# Patient Record
Sex: Female | Born: 1950 | Race: White | Hispanic: No | Marital: Married | Smoking: Never smoker
Health system: Southern US, Community
[De-identification: ages and names within clinical notes are randomized; demographics above are authoritative.]

## PROBLEM LIST (undated history)

## (undated) DIAGNOSIS — T7840XA Allergy, unspecified, initial encounter: Secondary | ICD-10-CM

## (undated) DIAGNOSIS — H269 Unspecified cataract: Secondary | ICD-10-CM

## (undated) HISTORY — PX: EYE SURGERY: SHX253

## (undated) HISTORY — DX: Allergy, unspecified, initial encounter: T78.40XA

## (undated) HISTORY — PX: BREAST SURGERY: SHX581

## (undated) HISTORY — DX: Unspecified cataract: H26.9

---

## 2002-10-18 HISTORY — PX: ENDOMETRIAL ABLATION: SHX621

## 2021-02-18 DIAGNOSIS — E785 Hyperlipidemia, unspecified: Secondary | ICD-10-CM

## 2021-02-18 DIAGNOSIS — W57XXXA Bitten or stung by nonvenomous insect and other nonvenomous arthropods, initial encounter: Secondary | ICD-10-CM | POA: Insufficient documentation

## 2021-02-18 DIAGNOSIS — H9190 Unspecified hearing loss, unspecified ear: Secondary | ICD-10-CM | POA: Insufficient documentation

## 2021-02-18 DIAGNOSIS — M8580 Other specified disorders of bone density and structure, unspecified site: Secondary | ICD-10-CM | POA: Insufficient documentation

## 2021-02-18 DIAGNOSIS — M810 Age-related osteoporosis without current pathological fracture: Secondary | ICD-10-CM

## 2021-02-18 HISTORY — DX: Age-related osteoporosis without current pathological fracture: M81.0

## 2021-02-18 HISTORY — DX: Hyperlipidemia, unspecified: E78.5

## 2021-06-03 ENCOUNTER — Ambulatory Visit
Admission: RE | Admit: 2021-06-03 | Discharge: 2021-06-03 | Disposition: A | Discharge status: Home / Self Care | Payer: Medicare PPO | Source: Ambulatory Visit | Attending: Internal Medicine | Admitting: Internal Medicine

## 2021-06-03 ENCOUNTER — Other Ambulatory Visit: Payer: Self-pay

## 2021-06-03 VITALS — BP 113/77 | HR 89 | Temp 98.7°F | Resp 18

## 2021-06-03 DIAGNOSIS — H109 Unspecified conjunctivitis: Secondary | ICD-10-CM

## 2021-06-03 DIAGNOSIS — J0100 Acute maxillary sinusitis, unspecified: Secondary | ICD-10-CM | POA: Diagnosis not present

## 2021-06-03 MED ORDER — ERYTHROMYCIN 5 MG/GM OP OINT: TOPICAL_OINTMENT | OPHTHALMIC | 0 refills | Status: DC

## 2021-06-03 MED ORDER — AMOXICILLIN 875 MG PO TABS: 875.0000 mg | ORAL_TABLET | Freq: Two times a day (BID) | ORAL | 0 refills | Status: AC

## 2021-06-03 NOTE — Discharge Instructions (Addendum)
You are being treated for acute sinus infection with amoxicillin antibiotic.  You are also being treated for left pinkeye with erythromycin eye ointment.  Please change pillowcase nightly to decrease transition of infection to other eye. Covid 19 test is pending. We will call if it is positive.

## 2021-06-03 NOTE — ED Triage Notes (Signed)
Pt said x 2 day has been having sinus headache and pressure and watery eyes. Pt said her right side of her teeth are hurting. Pt said green snot, pt has been taking mucinex and was helping but pain in face and swelling is getting worse.

## 2021-06-03 NOTE — ED Provider Notes (Signed)
[facility]    CSN: [id number] Arrival date & time: 06/03/21  1049      History   Chief Complaint Chief Complaint  Patient presents with   Facial Pain    HPI Lindsey Zuniga is a 70 y.o. female.   Patient presents with 1 week history of facial pressure, nasal congestion, watery eyes, right-sided teeth pain.  States that she has had some crusty drainage from her left eye that started yesterday.  Denies any blurry vision.  Denies any known fevers.  Granddaughter had similar symptoms approximately 1 week ago.  Has been taking over-the-counter Mucinex with no relief of symptoms.  Denies any chest pain or shortness of breath.    History reviewed. No pertinent past medical history.  There are no problems to display for this patient.   History reviewed. No pertinent surgical history.  OB History   No obstetric history on file.      Home Medications    Prior to Admission medications   Medication Sig Start Date End Date Taking? Authorizing Provider  amoxicillin (AMOXIL) 875 MG tablet Take 1 tablet (875 mg total) by mouth 2 (two) times daily for 10 days. 06/03/21 06/13/21 Yes Odis Luster, FNP  erythromycin ophthalmic ointment Place a 1 cm ribbon of ointment into the lower eyelid 4 times daily for 7 days. 06/03/21  Yes Odis Luster, FNP    Family History History reviewed. No pertinent family history.  Social History Social History   Tobacco Use   Smoking status: Never   Smokeless tobacco: Never  Substance Use Topics   Alcohol use: Not Currently   Drug use: Never     Allergies   Patient has no allergy information on record.   Review of Systems Review of Systems Per HPI  Physical Exam Triage Vital Signs ED Triage Vitals  Enc Vitals Group     BP 06/03/21 1115 113/77     Pulse Rate 06/03/21 1115 89     Resp 06/03/21 1115 18     Temp 06/03/21 1118 98.7 F (37.1 C)     Temp Source 06/03/21 1115 Oral     SpO2 06/03/21 1115 96 %      Weight --      Height --      Head Circumference --      Peak Flow --      Pain Score 06/03/21 1110 4     Pain Loc --      Pain Edu? --      Excl. in [place]? --    No data found.  Updated Vital Signs BP 113/77 (BP Location: Right Arm)   Pulse 89   Temp 98.7 F (37.1 C) (Oral)   Resp 18   SpO2 96%   Visual Acuity Right Eye Distance:   Left Eye Distance:   Bilateral Distance:    Right Eye Near:   Left Eye Near:    Bilateral Near:     Physical Exam Constitutional:      General: She is not in acute distress.    Appearance: Normal appearance.  HENT:     Head: Normocephalic and atraumatic.     Right Ear: Tympanic membrane and ear canal normal.     Left Ear: Tympanic membrane and ear canal normal.     Nose: Congestion present.     Right Sinus: Maxillary sinus tenderness present.     Left Sinus: Maxillary sinus tenderness present.     Mouth/Throat:  Mouth: Mucous membranes are moist.     Pharynx: Oropharynx is clear. No posterior oropharyngeal erythema.  Eyes:     General: Lids are normal.     Extraocular Movements: Extraocular movements intact.     Conjunctiva/sclera:     Left eye: Left conjunctiva is injected. No exudate.    Pupils: Pupils are equal, round, and reactive to light.  Cardiovascular:     Rate and Rhythm: Normal rate and regular rhythm.     Pulses: Normal pulses.     Heart sounds: Normal heart sounds.  Pulmonary:     Effort: Pulmonary effort is normal. No respiratory distress.     Breath sounds: Normal breath sounds. No wheezing.  Abdominal:     General: Abdomen is flat. Bowel sounds are normal.     Palpations: Abdomen is soft.  Musculoskeletal:        General: Normal range of motion.     Cervical back: Normal range of motion.  Skin:    General: Skin is warm and dry.  Neurological:     General: No focal deficit present.     Mental Status: She is alert and oriented to person, place, and time. Mental status is at baseline.  Psychiatric:         Mood and Affect: Mood normal.        Behavior: Behavior normal.     UC Treatments / Results  Labs (all labs ordered are listed, but only abnormal results are displayed) Labs Reviewed  NOVEL CORONAVIRUS, NAA    EKG   Radiology No results found.  Procedures Procedures (including critical care time)  Medications Ordered in UC Medications - No data to display  Initial Impression / Assessment and Plan / UC Course  I have reviewed the triage vital signs and the nursing notes.  Pertinent labs & imaging results that were available during my care of the patient were reviewed by me and considered in my medical decision making (see chart for details).     Physical exam most consistent with acute maxillary sinusitis.  Will treat with amoxicillin x10 days. Will treat left bacterial conjunctivitis with erythromycin.  No blurry vision and visual acuity appears normal.  COVID-19 test pending to rule out.  Discussed over-the-counter medications to alleviate symptoms with patient.Discussed strict return precautions. Patient verbalized understanding and is agreeable with plan.  Final Clinical Impressions(s) / UC Diagnoses   Final diagnoses:  Acute non-recurrent maxillary sinusitis  Bacterial conjunctivitis of left eye     Discharge Instructions      You are being treated for acute sinus infection with amoxicillin antibiotic.  You are also being treated for left pinkeye with erythromycin eye ointment.  Please change pillowcase nightly to decrease transition of infection to other eye. Covid 19 test is pending. We will call if it is positive.      ED Prescriptions     Medication Sig Dispense Auth. Provider   amoxicillin (AMOXIL) 875 MG tablet Take 1 tablet (875 mg total) by mouth 2 (two) times daily for 10 days. 20 tablet Odis Luster, FNP   erythromycin ophthalmic ointment Place a 1 cm ribbon of ointment into the lower eyelid 4 times daily for 7 days. 3.5 g Odis Luster, FNP       PDMP not reviewed this encounter.   Odis Luster, FNP 06/03/21 1202

## 2021-06-04 LAB — SARS-COV-2, NAA 2 DAY TAT

## 2021-06-04 LAB — NOVEL CORONAVIRUS, NAA: SARS-CoV-2, NAA: NOT DETECTED

## 2021-08-05 ENCOUNTER — Other Ambulatory Visit: Payer: Self-pay

## 2021-08-05 ENCOUNTER — Ambulatory Visit
Admission: RE | Admit: 2021-08-05 | Discharge: 2021-08-05 | Disposition: A | Discharge status: Home / Self Care | Payer: Medicare PPO | Source: Ambulatory Visit

## 2021-08-05 VITALS — BP 135/80 | HR 101 | Temp 98.2°F | Resp 18

## 2021-08-05 DIAGNOSIS — J0100 Acute maxillary sinusitis, unspecified: Secondary | ICD-10-CM | POA: Diagnosis not present

## 2021-08-05 MED ORDER — AMOXICILLIN-POT CLAVULANATE 875-125 MG PO TABS: 1.0000 | ORAL_TABLET | Freq: Two times a day (BID) | ORAL | 0 refills | Status: DC

## 2021-08-05 NOTE — ED Triage Notes (Signed)
Pt c/o nasal congestion and sinus pressure to lt upper cheek x1wk. States had the same sx's in august and had a sinus infection.

## 2021-08-05 NOTE — ED Provider Notes (Signed)
[facility]    CSN: [id number] Arrival date & time: 08/05/21  0945      History   Chief Complaint Chief Complaint  Patient presents with   appt 10-nasal congrestion    HPI Lindsey Zuniga is a 70 y.o. female.   Patient here today for evaluation of left maxillary sinus pressure and pain that has developed over the last week.  She reports she has had similar symptoms before and was diagnosed with sinus infection which improved with antibiotics.  She denies any fever.  She has not had any nausea, vomiting or diarrhea.  She has tried taking Mucinex and Claritin-D without resolution of symptoms.  The history is provided by the patient.   History reviewed. No pertinent past medical history.  There are no problems to display for this patient.   History reviewed. No pertinent surgical history.  OB History   No obstetric history on file.      Home Medications    Prior to Admission medications   Medication Sig Start Date End Date Taking? Authorizing Provider  amoxicillin-clavulanate (AUGMENTIN) 875-125 MG tablet Take 1 tablet by mouth every 12 (twelve) hours. 08/05/21  Yes Francene Finders, PA-C  aspirin (EQ ASPIRIN ADULT LOW DOSE) 81 MG EC tablet Take by mouth.    [provider]  Multiple Vitamin (MULTIVITAMIN) capsule Take 1 capsule by mouth daily.    [provider]  simvastatin (ZOCOR) 20 MG tablet Take 10 mg by mouth at bedtime. 07/23/21   [provider]    Family History History reviewed. No pertinent family history.  Social History Social History   Tobacco Use   Smoking status: Never   Smokeless tobacco: Never  Substance Use Topics   Alcohol use: Not Currently   Drug use: Never     Allergies   Fosamax [alendronate]   Review of Systems Review of Systems  Constitutional:  Negative for chills and fever.  HENT:  Positive for congestion and sinus pressure. Negative for ear pain and sore throat.   Eyes:   Negative for discharge and redness.  Respiratory:  Positive for cough (mild- from drainage). Negative for shortness of breath and wheezing.   Gastrointestinal:  Negative for abdominal pain, diarrhea, nausea and vomiting.    Physical Exam Triage Vital Signs ED Triage Vitals  Enc Vitals Group     BP      Pulse      Resp      Temp      Temp src      SpO2      Weight      Height      Head Circumference      Peak Flow      Pain Score      Pain Loc      Pain Edu?      Excl. in [place]?    No data found.  Updated Vital Signs BP 135/80 (BP Location: Left Arm)   Pulse (!) 101   Temp 98.2 F (36.8 C) (Oral)   Resp 18   SpO2 97%       Physical Exam Vitals and nursing note reviewed.  Constitutional:      General: She is not in acute distress.    Appearance: Normal appearance. She is not ill-appearing.  HENT:     Head: Normocephalic and atraumatic.     Right Ear: Tympanic membrane normal.     Left Ear: Tympanic membrane normal.     Nose:  Congestion present.     Mouth/Throat:     Mouth: Mucous membranes are moist.     Pharynx: No oropharyngeal exudate or posterior oropharyngeal erythema.  Eyes:     Conjunctiva/sclera: Conjunctivae normal.  Cardiovascular:     Rate and Rhythm: Normal rate and regular rhythm.     Heart sounds: Normal heart sounds. No murmur heard. Pulmonary:     Effort: Pulmonary effort is normal. No respiratory distress.     Breath sounds: Normal breath sounds. No wheezing, rhonchi or rales.  Skin:    General: Skin is warm and dry.  Neurological:     Mental Status: She is alert.  Psychiatric:        Mood and Affect: Mood normal.        Thought Content: Thought content normal.     UC Treatments / Results  Labs (all labs ordered are listed, but only abnormal results are displayed) Labs Reviewed - No data to display  EKG   Radiology No results found.  Procedures Procedures (including critical care time)  Medications Ordered in UC Medications  - No data to display  Initial Impression / Assessment and Plan / UC Course  I have reviewed the triage vital signs and the nursing notes.  Pertinent labs & imaging results that were available during my care of the patient were reviewed by me and considered in my medical decision making (see chart for details).  Suspect likely sinusitis and will treat with Augmentin.  Recommended follow-up if no gradual improvement of symptoms or if symptoms worsen anyway.  Final Clinical Impressions(s) / UC Diagnoses   Final diagnoses:  Acute maxillary sinusitis, recurrence not specified   Discharge Instructions   None    ED Prescriptions     Medication Sig Dispense Auth. Provider   amoxicillin-clavulanate (AUGMENTIN) 875-125 MG tablet Take 1 tablet by mouth every 12 (twelve) hours. 14 tablet Francene Finders, PA-C      PDMP not reviewed this encounter.   Francene Finders, PA-C 08/05/21 1109

## 2021-08-24 ENCOUNTER — Other Ambulatory Visit: Payer: Self-pay

## 2021-08-25 ENCOUNTER — Encounter: Payer: Self-pay | Admitting: Nurse Practitioner

## 2021-08-25 ENCOUNTER — Ambulatory Visit: Payer: Medicare PPO | Admitting: Nurse Practitioner

## 2021-08-25 VITALS — BP 124/80 | HR 86 | Temp 96.9°F | Ht 67.25 in | Wt 182.6 lb

## 2021-08-25 DIAGNOSIS — L570 Actinic keratosis: Secondary | ICD-10-CM | POA: Diagnosis not present

## 2021-08-25 DIAGNOSIS — Z0001 Encounter for general adult medical examination with abnormal findings: Secondary | ICD-10-CM | POA: Diagnosis not present

## 2021-08-25 DIAGNOSIS — H903 Sensorineural hearing loss, bilateral: Secondary | ICD-10-CM

## 2021-08-25 DIAGNOSIS — M810 Age-related osteoporosis without current pathological fracture: Secondary | ICD-10-CM

## 2021-08-25 DIAGNOSIS — E785 Hyperlipidemia, unspecified: Secondary | ICD-10-CM | POA: Diagnosis not present

## 2021-08-25 DIAGNOSIS — Z9882 Breast implant status: Secondary | ICD-10-CM | POA: Insufficient documentation

## 2021-08-25 NOTE — Progress Notes (Signed)
Subjective:    Patient ID: Lindsey Zuniga, female    DOB: 1951-09-20, 70 y.o.   MRN: [id number]  Patient presents today for CPE or establish care (new patient) or eval of chronic conditions   HPI Transfer from [facility]. Last CPE 2021.  Osteoporosis Last bone density 2020 Current use of multivitamin aith calcium and vitamin D Allergic reaction to fosamax. Able to tolerate boniva and actonel Took boniva x 35yrs, d/c 75yrs ago due to fear of possible side effects after long term use.  Ordered repeat bone density   Hyperlipidemia Current use to simvastatin 10mg  daily Repeat lipid panel  Hearing loss Use of hearing aids  D/c fosamax due to allergic response Able to take boniva and actonel x 42yrs, d/c 41yrs ago Current use of multivitamin with calcium and vit. D.  Vision:up to date Dental:up to date Diet:regular. Exercise:none, but plans to start Weight:  Wt Readings from Last 3 Encounters:  08/25/21 182 lb 9.6 oz (82.8 kg)    Sexual History (orientation,birth control, marital status, STD):up to date with mammogram, last done 07/2021 (normal). S/p uterine ablation 2004, Last PAP 2017: normal, ( nor hx of abnormal PAP, no Fhx of uterine or cervical cancer)  Depression/Suicide: Depression screen [facility] 2/9 08/25/2021  Decreased Interest 0  Down, Depressed, Hopeless 0  PHQ - 2 Score 0  Altered sleeping 0  Tired, decreased energy 0  Change in appetite 0  Feeling bad or failure about yourself  0  Trouble concentrating 0  Moving slowly or fidgety/restless 0  Suicidal thoughts 0  PHQ-9 Score 0   GAD 7 : Generalized Anxiety Score 08/25/2021  Nervous, Anxious, on Edge 0  Control/stop worrying 0  Worry too much - different things 0  Trouble relaxing 0  Restless 0  Easily annoyed or irritable 0  Afraid - awful might happen 0  Total GAD 7 Score 0   Immunizations: (TDAP, Hep C screen, Pneumovax, Influenza, zoster): will get TDAP from retail pharmarcy, has  completed flu vaccine this season. Health Maintenance  Topic Date Due   Hepatitis C Screening: USPSTF Recommendation to screen - Ages 4-79 yo.  Never done   Tetanus Vaccine  Never done   Colon Cancer Screening  Never done   DEXA scan (bone density measurement)  Never done   COVID-19 Vaccine (4 - Booster for Pfizer series) 12/09/2020   Flu Shot  Never done   Mammogram  08/15/2023   Pneumonia Vaccine  Completed   Zoster (Shingles) Vaccine  Completed   HPV Vaccine  Aged Out   Fall Risk: Fall Risk  08/25/2021  Falls in the past year? 0  Number falls in past yr: 0  Injury with Fall? 0  Risk for fall due to : No Fall Risks  Follow up Falls evaluation completed   Advanced Directive: Advanced Directives 08/25/2021  Does Patient Have a Medical Advance Directive? No;Yes  Type of Paramedic of [place];Living will  Copy of [place] in Chart? No - copy requested    Medications and allergies reviewed with patient and updated if appropriate.  Patient Active Problem List   Diagnosis Date Noted   H/O bilateral breast implants 08/25/2021   Hearing loss 02/18/2021   Hyperlipidemia 02/18/2021   Osteoporosis 02/18/2021   Tick bite 02/18/2021    Current Outpatient Medications on File Prior to Visit  Medication Sig Dispense Refill   aspirin 81 MG EC tablet Take by mouth.     Multiple  Vitamin (MULTIVITAMIN) capsule Take 1 capsule by mouth daily.     simvastatin (ZOCOR) 20 MG tablet Take 10 mg by mouth at bedtime.     No current facility-administered medications on file prior to visit.   Past Medical History:  Diagnosis Date   Allergy Unknown   Cataract 2006   Right eye cataract surgery- left eye monitored   Hyperlipidemia 02/18/2021   Osteoporosis 02/18/2021    Past Surgical History:  Procedure Laterality Date   [place]   EYE SURGERY  2006   Cataract right eye    Social History    Socioeconomic History   Marital status: Married    Spouse name: Not on file   Number of children: Not on file   Years of education: Not on file   Highest education level: Not on file  Occupational History   Not on file  Tobacco Use   Smoking status: Never   Smokeless tobacco: Never  Vaping Use   Vaping Use: Not on file  Substance and Sexual Activity   Alcohol use: Not Currently    Alcohol/week: 1.0 standard drink    Types: 1 Glasses of wine per week   Drug use: Never   Sexual activity: Yes    Birth control/protection: Post-menopausal  Other Topics Concern   Not on file  Social History Narrative   Not on file   Social Determinants of Health   Financial Resource Strain: Not on file  Food Insecurity: Not on file  Transportation Needs: Not on file  Physical Activity: Not on file  Stress: Not on file  Social Connections: Not on file    Family History  Problem Relation Age of Onset   Varicose Veins Mother    Cancer Father    Hearing loss Father    Varicose Veins Father    Hearing loss Maternal Grandfather    Hearing loss Maternal Grandmother    Hearing loss Paternal Grandmother        Review of Systems  Constitutional:  Negative for fever, malaise/fatigue and weight loss.  HENT:  Negative for congestion and sore throat.   Eyes:        Negative for visual changes  Respiratory:  Negative for cough and shortness of breath.   Cardiovascular:  Negative for chest pain, palpitations and leg swelling.  Gastrointestinal:  Negative for blood in stool, constipation, diarrhea and heartburn.  Genitourinary:  Negative for dysuria, frequency and urgency.  Musculoskeletal:  Negative for falls, joint pain and myalgias.  Skin:  Negative for rash.  Neurological:  Negative for dizziness, sensory change and headaches.  Endo/Heme/Allergies:  Does not bruise/bleed easily.  Psychiatric/Behavioral:  Negative for depression, hallucinations, substance abuse and suicidal ideas. The  patient is not nervous/anxious and does not have insomnia.    Objective:   Vitals:   08/25/21 1017  BP: 124/80  Pulse: 86  Temp: (!) 96.9 F (36.1 C)  SpO2: 98%    Body mass index is 28.39 kg/m.   Physical Examination:  Physical Exam Vitals reviewed.  Constitutional:      General: She is not in acute distress.    Appearance: She is well-developed. She is obese.  HENT:     Right Ear: Tympanic membrane, ear canal and external ear normal.     Left Ear: Tympanic membrane, ear canal and external ear normal.  Eyes:     Extraocular Movements: Extraocular movements intact.  Conjunctiva/sclera: Conjunctivae normal.  Cardiovascular:     Rate and Rhythm: Normal rate and regular rhythm.     Heart sounds: Normal heart sounds.  Pulmonary:     Effort: Pulmonary effort is normal. No respiratory distress.     Breath sounds: Normal breath sounds.  Chest:     Chest wall: No tenderness.  Abdominal:     General: Bowel sounds are normal.     Palpations: Abdomen is soft.  Musculoskeletal:        General: Normal range of motion.     Cervical back: Normal range of motion and neck supple.     Right lower leg: No edema.     Left lower leg: No edema.  Skin:    General: Skin is warm and dry.     Findings: Lesion present.          Comments: Irregular keratotic lesion on right posterior shoulder.  Neurological:     Mental Status: She is alert and oriented to person, place, and time.     Deep Tendon Reflexes: Reflexes are normal and symmetric.  Psychiatric:        Mood and Affect: Mood normal.        Behavior: Behavior normal.        Thought Content: Thought content normal.   ASSESSMENT and PLAN: This visit occurred during the SARS-CoV-2 public health emergency.  Safety protocols were in place, including screening questions prior to the visit, additional usage of staff PPE, and extensive cleaning of exam room while observing appropriate contact time as indicated for disinfecting  solutions.   Kadie was seen today for establish care.  Diagnoses and all orders for this visit:  Encounter for preventative adult health care exam with abnormal findings -     Comprehensive metabolic panel; Future -     CBC; Future  Hyperlipidemia, unspecified hyperlipidemia type -     Lipid panel; Future  Age-related osteoporosis without current pathological fracture -     DG Bone Density; Future  Actinic keratosis -     Ambulatory referral to Dermatology  Sensorineural hearing loss (SNHL) of both ears    Schedule lab appt for blood draw. Need to be fasting 8hrs prior to blood draw You will be contacted to schedule appt for bone density. Please send copy of living will and health care power of attorney. Send dates of last flu and TDAP vaccine Start and maintain daily exercise and heart healthy diet.  Problem List Items Addressed This Visit       Nervous and Auditory   Hearing loss    Use of hearing aids        Musculoskeletal and Integument   Osteoporosis    Last bone density 2020 Current use of multivitamin aith calcium and vitamin D Allergic reaction to fosamax. Able to tolerate boniva and actonel Took boniva x 54yrs, d/c 44yrs ago due to fear of possible side effects after long term use.  Ordered repeat bone density       Relevant Orders   DG Bone Density     Other   Hyperlipidemia    Current use to simvastatin 10mg  daily Repeat lipid panel      Relevant Orders   Lipid panel   Other Visit Diagnoses     Encounter for preventative adult health care exam with abnormal findings    -  Primary   Relevant Orders   Comprehensive metabolic panel   CBC   Actinic keratosis  Relevant Orders   Ambulatory referral to Dermatology       Follow up: Return in about 1 year (around 08/25/2022) for CPE (fasting).  Wilfred Lacy, NP

## 2021-08-25 NOTE — Patient Instructions (Addendum)
Thank you for choosing [facility]  Schedule lab appt for blood draw. Need to be fasting 8hrs prior to blood draw  You will be contacted to schedule appt for bone density.  Please send copy of living will and health care power of attorney.  Send dates of last flu and TDAP vaccine  Preventive Care 39 Years and Older, Female Preventive care refers to lifestyle choices and visits with your health care provider that can promote health and wellness. Preventive care visits are also called wellness exams. What can I expect for my preventive care visit? Counseling Your health care provider may ask you questions about your: Medical history, including: Past medical problems. Family medical history. Pregnancy and menstrual history. History of falls. Current health, including: Memory and ability to understand (cognition). Emotional well-being. Home life and relationship well-being. Sexual activity and sexual health. Lifestyle, including: Alcohol, nicotine or tobacco, and drug use. Access to firearms. Diet, exercise, and sleep habits. Work and work Statistician. Sunscreen use. Safety issues such as seatbelt and bike helmet use. Physical exam Your health care provider will check your: Height and weight. These may be used to calculate your BMI (body mass index). BMI is a measurement that tells if you are at a healthy weight. Waist circumference. This measures the distance around your waistline. This measurement also tells if you are at a healthy weight and may help predict your risk of certain diseases, such as type 2 diabetes and high blood pressure. Heart rate and blood pressure. Body temperature. Skin for abnormal spots. What immunizations do I need? Vaccines are usually given at various ages, according to a schedule. Your health care provider will recommend vaccines for you based on your age, medical history, and lifestyle or other factors, such as travel or where you work. What  tests do I need? Screening Your health care provider may recommend screening tests for certain conditions. This may include: Lipid and cholesterol levels. Hepatitis C test. Hepatitis B test. HIV (human immunodeficiency virus) test. STI (sexually transmitted infection) testing, if you are at risk. Lung cancer screening. Colorectal cancer screening. Diabetes screening. This is done by checking your blood sugar (glucose) after you have not eaten for a while (fasting). Mammogram. Talk with your health care provider about how often you should have regular mammograms. BRCA-related cancer screening. This may be done if you have a family history of breast, ovarian, tubal, or peritoneal cancers. Bone density scan. This is done to screen for osteoporosis. Talk with your health care provider about your test results, treatment options, and if necessary, the need for more tests. Follow these instructions at home: Eating and drinking  Eat a diet that includes fresh fruits and vegetables, whole grains, lean protein, and low-fat dairy products. Limit your intake of foods with high amounts of sugar, saturated fats, and salt. Take vitamin and mineral supplements as recommended by your health care provider. Do not drink alcohol if your health care provider tells you not to drink. If you drink alcohol: Limit how much you have to 0-1 drink a day. Know how much alcohol is in your drink. In the [place] one drink equals one 12 oz bottle of beer (355 mL), one 5 oz glass of wine (148 mL), or one 1 oz glass of hard liquor (44 mL). Lifestyle Brush your teeth every morning and night with fluoride toothpaste. Floss one time each day. Exercise for at least 30 minutes 5 or more days each week. Do not use any products that contain  nicotine or tobacco. These products include cigarettes, chewing tobacco, and vaping devices, such as e-cigarettes. If you need help quitting, ask your health care provider. Do not use drugs. If  you are sexually active, practice safe sex. Use a condom or other form of protection in order to prevent STIs. Take aspirin only as told by your health care provider. Make sure that you understand how much to take and what form to take. Work with your health care provider to find out whether it is safe and beneficial for you to take aspirin daily. Ask your health care provider if you need to take a cholesterol-lowering medicine (statin). Find healthy ways to manage stress, such as: Meditation, yoga, or listening to music. Journaling. Talking to a trusted person. Spending time with friends and family. Minimize exposure to UV radiation to reduce your risk of skin cancer. Safety Always wear your seat belt while driving or riding in a vehicle. Do not drive: If you have been drinking alcohol. Do not ride with someone who has been drinking. When you are tired or distracted. While texting. If you have been using any mind-altering substances or drugs. Wear a helmet and other protective equipment during sports activities. If you have firearms in your house, make sure you follow all gun safety procedures. What's next? Visit your health care provider once a year for an annual wellness visit. Ask your health care provider how often you should have your eyes and teeth checked. Stay up to date on all vaccines. This information is not intended to replace advice given to you by your health care provider. Make sure you discuss any questions you have with your health care provider. Document Revised: 04/01/2021 Document Reviewed: 04/01/2021 Elsevier Patient Education  [place].

## 2021-08-25 NOTE — Progress Notes (Deleted)
   Subjective:  Patient ID: Lindsey Zuniga, female    DOB: 06/02/1951  Age: 70 y.o. MRN: [id number]  CC: Establish Care (New patient/)   HPI  No problem-specific Assessment & Plan notes found for this encounter.   Reviewed past Medical, Social and Family history today.  Outpatient Medications Prior to Visit  Medication Sig Dispense Refill   aspirin 81 MG EC tablet Take by mouth.     Multiple Vitamin (MULTIVITAMIN) capsule Take 1 capsule by mouth daily.     simvastatin (ZOCOR) 20 MG tablet Take 10 mg by mouth at bedtime.     amoxicillin-clavulanate (AUGMENTIN) 875-125 MG tablet Take 1 tablet by mouth every 12 (twelve) hours. 14 tablet 0   No facility-administered medications prior to visit.    ROS See HPI  Objective:  BP 124/80 (BP Location: Left Arm, Patient Position: Sitting, Cuff Size: Normal)   Pulse 86   Temp (!) 96.9 F (36.1 C) (Temporal)   Ht 5' 7.25" (1.708 m)   Wt 182 lb 9.6 oz (82.8 kg)   SpO2 98%   BMI 28.39 kg/m   Physical Exam  Assessment & Plan:  This visit occurred during the SARS-CoV-2 public health emergency.  Safety protocols were in place, including screening questions prior to the visit, additional usage of staff PPE, and extensive cleaning of exam room while observing appropriate contact time as indicated for disinfecting solutions.   There are no diagnoses linked to this encounter.  Problem List Items Addressed This Visit   None    I have spent ***mins with this patient regarding history taking, documentation, review of *** (labs, radiology, specialty note), formulating plan and discussing treatment options with patient.  Follow-up: No follow-ups on file.  Wilfred Lacy, NP

## 2021-08-25 NOTE — Assessment & Plan Note (Signed)
Current use to simvastatin 10mg  daily Repeat lipid panel

## 2021-08-25 NOTE — Assessment & Plan Note (Signed)
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 Able to tolerate boniva and actonel Took boniva x 72yrs, d/c 80yrs ago due to fear of possible side effects after long term use.  Ordered repeat bone density

## 2021-08-25 NOTE — Assessment & Plan Note (Signed)
Use of hearing aids

## 2021-08-27 ENCOUNTER — Encounter: Payer: Self-pay | Admitting: Nurse Practitioner

## 2021-09-01 ENCOUNTER — Other Ambulatory Visit (INDEPENDENT_AMBULATORY_CARE_PROVIDER_SITE_OTHER): Payer: Medicare PPO

## 2021-09-01 ENCOUNTER — Other Ambulatory Visit: Payer: Self-pay

## 2021-09-01 DIAGNOSIS — E785 Hyperlipidemia, unspecified: Secondary | ICD-10-CM | POA: Diagnosis not present

## 2021-09-01 DIAGNOSIS — Z0001 Encounter for general adult medical examination with abnormal findings: Secondary | ICD-10-CM | POA: Diagnosis not present

## 2021-09-01 LAB — LIPID PANEL
Cholesterol: 150 mg/dL (ref 0–200)
HDL: 58.2 mg/dL (ref >39.00)
LDL Cholesterol: 74 mg/dL (ref 0–99)
NonHDL: 91.75
Total CHOL/HDL Ratio: 3
Triglycerides: 91 mg/dL (ref 0.0–149.0)
VLDL: 18.2 mg/dL (ref 0.0–40.0)

## 2021-09-01 LAB — CBC
HCT: 43.9 % (ref 36.0–46.0)
Hemoglobin: 14.4 g/dL (ref 12.0–15.0)
MCHC: 32.7 g/dL (ref 30.0–36.0)
MCV: 95.1 fl (ref 78.0–100.0)
Platelets: 266 10*3/uL (ref 150.0–400.0)
RBC: 4.62 Mil/uL (ref 3.87–5.11)
RDW: 13.4 % (ref 11.5–15.5)
WBC: 6.5 10*3/uL (ref 4.0–10.5)

## 2021-09-01 LAB — COMPREHENSIVE METABOLIC PANEL
ALT: 15 U/L (ref 0–35)
AST: 18 U/L (ref 0–37)
Albumin: 4.2 g/dL (ref 3.5–5.2)
Alkaline Phosphatase: 85 U/L (ref 39–117)
BUN: 17 mg/dL (ref 6–23)
CO2: 30 mEq/L (ref 19–32)
Calcium: 8.9 mg/dL (ref 8.4–10.5)
Chloride: 105 mEq/L (ref 96–112)
Creatinine, Ser: 0.7 mg/dL (ref 0.40–1.20)
GFR: 88.33 mL/min (ref >60.00)
Glucose, Bld: 87 mg/dL (ref 70–99)
Potassium: 4.2 mEq/L (ref 3.5–5.1)
Sodium: 141 mEq/L (ref 135–145)
Total Bilirubin: 0.7 mg/dL (ref 0.2–1.2)
Total Protein: 6.8 g/dL (ref 6.0–8.3)

## 2021-09-02 MED ORDER — SIMVASTATIN 20 MG PO TABS: 10.0000 mg | ORAL_TABLET | Freq: Every day | ORAL | 3 refills | Status: DC

## 2021-09-02 NOTE — Addendum Note (Signed)
Addended by: Wilfred Lacy L on: 09/02/2021 09:00 AM   Modules accepted: Orders

## 2021-10-21 ENCOUNTER — Ambulatory Visit (INDEPENDENT_AMBULATORY_CARE_PROVIDER_SITE_OTHER): Payer: Medicare PPO

## 2021-10-21 DIAGNOSIS — Z1211 Encounter for screening for malignant neoplasm of colon: Secondary | ICD-10-CM

## 2021-10-21 DIAGNOSIS — Z0000 Encounter for general adult medical examination without abnormal findings: Secondary | ICD-10-CM

## 2021-10-21 NOTE — Patient Instructions (Signed)
Lindsey Zuniga , Thank you for taking time to come for your Medicare Wellness Visit. I appreciate your ongoing commitment to your health goals. Please review the following plan we discussed and let me know if I can assist you in the future.   Screening recommendations/referrals: Colonoscopy: referral 10/21/2021 Mammogram: 08/14/2021 Bone Density: 02/05/2022 Recommended yearly ophthalmology/optometry visit for glaucoma screening and checkup Recommended yearly dental visit for hygiene and checkup  Vaccinations: Influenza vaccine: Completed  Pneumococcal vaccine: Completed  Tdap vaccine: 08/26/2021 Shingles vaccine: completed     Advanced directives: yes   Conditions/risks identified: none   Next appointment: none    Preventive Care 70 Years and Older, Female Preventive care refers to lifestyle choices and visits with your health care provider that can promote health and wellness. What does preventive care include? A yearly physical exam. This is also called an annual well check. Dental exams once or twice a year. Routine eye exams. Ask your health care provider how often you should have your eyes checked. Personal lifestyle choices, including: Daily care of your teeth and gums. Regular physical activity. Eating a healthy diet. Avoiding tobacco and drug use. Limiting alcohol use. Practicing safe sex. Taking low-dose aspirin every day. Taking vitamin and mineral supplements as recommended by your health care provider. What happens during an annual well check? The services and screenings done by your health care provider during your annual well check will depend on your age, overall health, lifestyle risk factors, and family history of disease. Counseling  Your health care provider may ask you questions about your: Alcohol use. Tobacco use. Drug use. Emotional well-being. Home and relationship well-being. Sexual activity. Eating habits. History of falls. Memory and ability  to understand (cognition). Work and work Statistician. Reproductive health. Screening  You may have the following tests or measurements: Height, weight, and BMI. Blood pressure. Lipid and cholesterol levels. These may be checked every 5 years, or more frequently if you are over 20 years old. Skin check. Lung cancer screening. You may have this screening every year starting at age 14 if you have a 30-pack-year history of smoking and currently smoke or have quit within the past 15 years. Fecal occult blood test (FOBT) of the stool. You may have this test every year starting at age 7. Flexible sigmoidoscopy or colonoscopy. You may have a sigmoidoscopy every 5 years or a colonoscopy every 10 years starting at age 70. Hepatitis C blood test. Hepatitis B blood test. Sexually transmitted disease (STD) testing. Diabetes screening. This is done by checking your blood sugar (glucose) after you have not eaten for a while (fasting). You may have this done every 1-3 years. Bone density scan. This is done to screen for osteoporosis. You may have this done starting at age 65. Mammogram. This may be done every 1-2 years. Talk to your health care provider about how often you should have regular mammograms. Talk with your health care provider about your test results, treatment options, and if necessary, the need for more tests. Vaccines  Your health care provider may recommend certain vaccines, such as: Influenza vaccine. This is recommended every year. Tetanus, diphtheria, and acellular pertussis (Tdap, Td) vaccine. You may need a Td booster every 10 years. Zoster vaccine. You may need this after age 70. Pneumococcal 13-valent conjugate (PCV13) vaccine. One dose is recommended after age 70. Pneumococcal polysaccharide (PPSV23) vaccine. One dose is recommended after age 70. Talk to your health care provider about which screenings and vaccines you need and how often  you need them. This information is not  intended to replace advice given to you by your health care provider. Make sure you discuss any questions you have with your health care provider. Document Released: 10/31/2015 Document Revised: 06/23/2016 Document Reviewed: 08/05/2015 Elsevier Interactive Patient Education  2017 [place] Prevention in the Home Falls can cause injuries. They can happen to people of all ages. There are many things you can do to make your home safe and to help prevent falls. What can I do on the outside of my home? Regularly fix the edges of walkways and driveways and fix any cracks. Remove anything that might make you trip as you walk through a door, such as a raised step or threshold. Trim any bushes or trees on the path to your home. Use bright outdoor lighting. Clear any walking paths of anything that might make someone trip, such as rocks or tools. Regularly check to see if handrails are loose or broken. Make sure that both sides of any steps have handrails. Any raised decks and porches should have guardrails on the edges. Have any leaves, snow, or ice cleared regularly. Use sand or salt on walking paths during winter. Clean up any spills in your garage right away. This includes oil or grease spills. What can I do in the bathroom? Use night lights. Install grab bars by the toilet and in the tub and shower. Do not use towel bars as grab bars. Use non-skid mats or decals in the tub or shower. If you need to sit down in the shower, use a plastic, non-slip stool. Keep the floor dry. Clean up any water that spills on the floor as soon as it happens. Remove soap buildup in the tub or shower regularly. Attach bath mats securely with double-sided non-slip rug tape. Do not have throw rugs and other things on the floor that can make you trip. What can I do in the bedroom? Use night lights. Make sure that you have a light by your bed that is easy to reach. Do not use any sheets or blankets that are  too big for your bed. They should not hang down onto the floor. Have a firm chair that has side arms. You can use this for support while you get dressed. Do not have throw rugs and other things on the floor that can make you trip. What can I do in the kitchen? Clean up any spills right away. Avoid walking on wet floors. Keep items that you use a lot in easy-to-reach places. If you need to reach something above you, use a strong step stool that has a grab bar. Keep electrical cords out of the way. Do not use floor polish or wax that makes floors slippery. If you must use wax, use non-skid floor wax. Do not have throw rugs and other things on the floor that can make you trip. What can I do with my stairs? Do not leave any items on the stairs. Make sure that there are handrails on both sides of the stairs and use them. Fix handrails that are broken or loose. Make sure that handrails are as long as the stairways. Check any carpeting to make sure that it is firmly attached to the stairs. Fix any carpet that is loose or worn. Avoid having throw rugs at the top or bottom of the stairs. If you do have throw rugs, attach them to the floor with carpet tape. Make sure that you have a light  switch at the top of the stairs and the bottom of the stairs. If you do not have them, ask someone to add them for you. What else can I do to help prevent falls? Wear shoes that: Do not have high heels. Have rubber bottoms. Are comfortable and fit you well. Are closed at the toe. Do not wear sandals. If you use a stepladder: Make sure that it is fully opened. Do not climb a closed stepladder. Make sure that both sides of the stepladder are locked into place. Ask someone to hold it for you, if possible. Clearly mark and make sure that you can see: Any grab bars or handrails. First and last steps. Where the edge of each step is. Use tools that help you move around (mobility aids) if they are needed. These  include: Canes. Walkers. Scooters. Crutches. Turn on the lights when you go into a dark area. Replace any light bulbs as soon as they burn out. Set up your furniture so you have a clear path. Avoid moving your furniture around. If any of your floors are uneven, fix them. If there are any pets around you, be aware of where they are. Review your medicines with your doctor. Some medicines can make you feel dizzy. This can increase your chance of falling. Ask your doctor what other things that you can do to help prevent falls. This information is not intended to replace advice given to you by your health care provider. Make sure you discuss any questions you have with your health care provider. Document Released: 07/31/2009 Document Revised: 03/11/2016 Document Reviewed: 11/08/2014 Elsevier Interactive Patient Education  2017 [employer].

## 2021-10-21 NOTE — Progress Notes (Signed)
Subjective:   Lindsey Zuniga is a 71 y.o. female who presents for an Initial Medicare Annual Wellness Visit.  Review of Systems     Cardiac Risk Factors include: advanced age (>24men, >61 women);dyslipidemia     Objective:    Today's Vitals   There is no height or weight on file to calculate BMI.  Advanced Directives 10/21/2021 08/25/2021  Does Patient Have a Medical Advance Directive? Yes No;Yes  Type of Paramedic of [place];Living will [place];Living will  Copy of [place] in Chart? No - copy requested No - copy requested    Current Medications (verified) Outpatient Encounter Medications as of 10/21/2021  Medication Sig   aspirin 81 MG EC tablet Take by mouth.   Cholecalciferol (VITAMIN D3) 50 MCG (2000 UT) TABS    ciprofloxacin (CIPRO) 500 MG tablet Take 500 mg by mouth 2 (two) times daily.   Multiple Vitamin (MULTIVITAMIN) capsule Take 1 capsule by mouth daily.   simvastatin (ZOCOR) 20 MG tablet Take 0.5 tablets (10 mg total) by mouth at bedtime.   cephALEXin (KEFLEX) 500 MG capsule Take 500 mg by mouth 4 (four) times daily.   No facility-administered encounter medications on file as of 10/21/2021.    Allergies (verified) Fosamax [alendronate]   History: Past Medical History:  Diagnosis Date   Allergy Unknown   Cataract 2006   Right eye cataract surgery- left eye monitored   Hyperlipidemia 02/18/2021   Osteoporosis 02/18/2021   Past Surgical History:  Procedure Laterality Date   [place]   EYE SURGERY  2006   Cataract right eye   Family History  Problem Relation Age of Onset   Varicose Veins Mother    Cancer Father    Hearing loss Father    Varicose Veins Father    Hearing loss Maternal Grandfather    Hearing loss Maternal Grandmother    Hearing loss Paternal Grandmother    Social History   Socioeconomic History   Marital status:  Married    Spouse name: Not on file   Number of children: Not on file   Years of education: Not on file   Highest education level: Not on file  Occupational History   Not on file  Tobacco Use   Smoking status: Never   Smokeless tobacco: Never  Vaping Use   Vaping Use: Not on file  Substance and Sexual Activity   Alcohol use: Not Currently    Alcohol/week: 1.0 standard drink    Types: 1 Glasses of wine per week   Drug use: Never   Sexual activity: Yes    Birth control/protection: Post-menopausal  Other Topics Concern   Not on file  Social History Narrative   Not on file   Social Determinants of Health   Financial Resource Strain: Low Risk    Difficulty of Paying Living Expenses: Not hard at all  Food Insecurity: No Food Insecurity   Worried About Charity fundraiser in the Last Year: Never true   [place] in the Last Year: Never true  Transportation Needs: No Transportation Needs   Lack of Transportation (Medical): No   Lack of Transportation (Non-Medical): No  Physical Activity: Insufficiently Active   Days of Exercise per Week: 2 days   Minutes of Exercise per Session: 20 min  Stress: No Stress Concern Present   Feeling of Stress : Not at all  Social Connections: Moderately Isolated   Frequency of Communication with Friends and Family: Twice a week   Frequency of Social Gatherings with Friends and Family: Twice a week   Attends Religious Services: Never   Printmaker: No   Attends Music therapist: Never   Marital Status: Married    Tobacco Counseling Counseling given: Not Answered   Clinical Intake:  Pre-visit preparation completed: Yes  Pain : No/denies pain     Nutritional Risks: None Diabetes: No  How often do you need to have someone help you when you read instructions, pamphlets, or other written materials from your doctor or pharmacy?: 1 - Never What is the last grade level you completed in  school?: High school  Diabetic?no   Interpreter Needed?: No  Information entered by :: L.Navy Belay,LPN   Activities of Daily Living In your present state of health, do you have any difficulty performing the following activities: 10/21/2021  Hearing? N  Vision? N  Difficulty concentrating or making decisions? N  Walking or climbing stairs? N  Dressing or bathing? N  Doing errands, shopping? N  Preparing Food and eating ? N  Using the Toilet? N  In the past six months, have you accidently leaked urine? N  Do you have problems with loss of bowel control? N  Managing your Medications? N  Managing your Finances? N  Housekeeping or managing your Housekeeping? N    Patient Care Team: Nche, Charlene Brooke, NP as PCP - General (Internal Medicine)  Indicate any recent Medical Services you may have received from other than [facility] providers in the past year (date may be approximate).     Assessment:   This is a routine wellness examination for Lindsey Zuniga.  Hearing/Vision screen Vision Screening - Comments:: Annual eye exams wears glasses   Dietary issues and exercise activities discussed: Current Exercise Habits: Home exercise routine, Type of exercise: walking, Time (Minutes): 20, Frequency (Times/Week): 2, Weekly Exercise (Minutes/Week): 40, Intensity: Mild, Exercise limited by: None identified   Goals Addressed   None    Depression Screen PHQ 2/9 Scores 10/21/2021 10/21/2021 08/25/2021  PHQ - 2 Score 0 0 0  PHQ- 9 Score - - 0    Fall Risk Fall Risk  10/21/2021 08/25/2021  Falls in the past year? 0 0  Number falls in past yr: 0 0  Injury with Fall? 0 0  Risk for fall due to : - No Fall Risks  Follow up Falls evaluation completed Falls evaluation completed    [place]:  Any stairs in or around the home? Yes  If so, are there any without handrails? No  Home free of loose throw rugs in walkways, pet beds, electrical cords, etc? Yes  Adequate lighting  in your home to reduce risk of falls? Yes   ASSISTIVE DEVICES UTILIZED TO PREVENT FALLS:  Life alert? No  Use of a cane, walker or w/c? No  Grab bars in the bathroom? No  Shower chair or bench in shower? No  Elevated toilet seat or a handicapped toilet? No     Cognitive Function:  Normal cognitive status assessed by direct observation by this Nurse Health Advisor. No abnormalities found.        Immunizations Immunization History  Administered Date(s) Administered   Influenza-Unspecified 07/17/2021   PFIZER(Purple Top)SARS-COV-2 Vaccination 11/10/2019, 12/01/2019, 10/14/2020   Pneumococcal Conjugate-13 07/14/2018   Pneumococcal Polysaccharide-23 09/27/2019   Td 08/26/2021   Zoster Recombinat (Shingrix)  09/27/2019, 12/27/2019    TDAP status: Up to date  Flu Vaccine status: Up to date  Pneumococcal vaccine status: Up to date  Covid-19 vaccine status: Completed vaccines  Qualifies for Shingles Vaccine? Yes   Zostavax completed Yes   Shingrix Completed?: Yes  Screening Tests Health Maintenance  Topic Date Due   Hepatitis C Screening  Never done   COLONOSCOPY (Pts 45-66yrs Insurance coverage will need to be confirmed)  Never done   DEXA SCAN  Never done   COVID-19 Vaccine (4 - Booster for Pfizer series) 12/09/2020   MAMMOGRAM  08/14/2022   TETANUS/TDAP  08/27/2031   Pneumonia Vaccine 15+ Years old  Completed   INFLUENZA VACCINE  Completed   Zoster Vaccines- Shingrix  Completed   HPV VACCINES  Aged Out    Health Maintenance  Health Maintenance Due  Topic Date Due   Hepatitis C Screening  Never done   COLONOSCOPY (Pts 45-45yrs Insurance coverage will need to be confirmed)  Never done   DEXA SCAN  Never done   COVID-19 Vaccine (4 - Booster for [place] series) 12/09/2020    Colorectal cancer screening: Referral to GI placed 10/21/2021. Pt aware the office will call re: appt.  Mammogram status: Completed 08/14/2021. Repeat every year  Bone Density status:  Completed Scheduled 02/05/2022. Results reflect: Bone density results: OSTEOPENIA. Repeat every 5 years.  Lung Cancer Screening: (Low Dose CT Chest recommended if Age 67-80 years, 30 pack-year currently smoking OR have quit w/in 15years.) does not qualify.   Lung Cancer Screening Referral: n/a  Additional Screening:  Hepatitis C Screening: does not qualify;   Vision Screening: Recommended annual ophthalmology exams for early detection of glaucoma and other disorders of the eye. Is the patient up to date with their annual eye exam?  Yes  Who is the provider or what is the name of the office in which the patient attends annual eye exams? Dr.harper  If pt is not established with a provider, would they like to be referred to a provider to establish care? No .   Dental Screening: Recommended annual dental exams for proper oral hygiene  Community Resource Referral / Chronic Care Management: CRR required this visit?  No   CCM required this visit?  No      Plan:     I have personally reviewed and noted the following in the patients chart:   Medical and social history Use of alcohol, tobacco or illicit drugs  Current medications and supplements including opioid prescriptions. Patient is not currently taking opioid prescriptions. Functional ability and status Nutritional status Physical activity Advanced directives List of other physicians Hospitalizations, surgeries, and ER visits in previous 12 months Vitals Screenings to include cognitive, depression, and falls Referrals and appointments  In addition, I have reviewed and discussed with patient certain preventive protocols, quality metrics, and best practice recommendations. A written personalized care plan for preventive services as well as general preventive health recommendations were provided to patient.     Randel Pigg, LPN   04/24/6766   Nurse Notes: none

## 2021-12-02 ENCOUNTER — Encounter: Payer: Self-pay | Admitting: Nurse Practitioner

## 2022-01-27 ENCOUNTER — Other Ambulatory Visit: Payer: Self-pay | Admitting: Nurse Practitioner

## 2022-01-27 DIAGNOSIS — M810 Age-related osteoporosis without current pathological fracture: Secondary | ICD-10-CM

## 2022-02-05 ENCOUNTER — Ambulatory Visit
Admission: RE | Admit: 2022-02-05 | Discharge: 2022-02-05 | Disposition: A | Discharge status: Home / Self Care | Payer: Medicare PPO | Source: Ambulatory Visit | Attending: Nurse Practitioner | Admitting: Nurse Practitioner

## 2022-02-05 DIAGNOSIS — M810 Age-related osteoporosis without current pathological fracture: Secondary | ICD-10-CM

## 2022-05-06 ENCOUNTER — Encounter: Payer: Self-pay | Admitting: Gastroenterology

## 2022-06-07 ENCOUNTER — Encounter: Payer: Self-pay | Admitting: Gastroenterology

## 2022-06-07 ENCOUNTER — Ambulatory Visit: Payer: Medicare PPO | Admitting: Gastroenterology

## 2022-06-07 VITALS — BP 136/82 | HR 91 | Ht 67.5 in | Wt 189.0 lb

## 2022-06-07 DIAGNOSIS — Z1211 Encounter for screening for malignant neoplasm of colon: Secondary | ICD-10-CM

## 2022-06-07 DIAGNOSIS — Z1212 Encounter for screening for malignant neoplasm of rectum: Secondary | ICD-10-CM

## 2022-06-07 MED ORDER — NA SULFATE-K SULFATE-MG SULF 17.5-3.13-1.6 GM/177ML PO SOLN: 1.0000 | Freq: Once | ORAL | 0 refills | Status: AC

## 2022-06-07 NOTE — Progress Notes (Signed)
HPI : Lindsey Zuniga is a very pleasant 71 year old female with a history of osteoporosis who is referred to [place] by Lindsey Brooke, NP for colon cancer screening.  The patient states that she had one previous colonoscopy over 10 years ago in the [place], [place] area.  She doesn't think there were any polyps, but does think that she was told that the bowel prep quality wasn't the best.  She does not recall the practice or the name of the provider.  She thinks she was supposed to have repeated a colonoscopy around the time the pandemic started, but was not able to at the time.  She recently moved to the [place] area and has been putting off repeating the colonoscopy.  She denies any chronic GI symptoms such as frequent abdominal pain, constipation or diarrhea.  No blood in the stool.  No family history of GI malignancy.  She has rare GERD symptoms.  Her weight is stable.  She is having foot surgery in October to remove a ganglion cyst   Past Medical History:  Diagnosis Date   Allergy Unknown   Cataract 2006   Right eye cataract surgery- left eye monitored   Hyperlipidemia 02/18/2021   Osteoporosis 02/18/2021     Past Surgical History:  Procedure Laterality Date   [place] SURGERY  2006   Cataract right eye   Family History  Problem Relation Age of Onset   Varicose Veins Mother    Cancer Father    Hearing loss Father    Varicose Veins Father    Hearing loss Maternal Grandfather    Hearing loss Maternal Grandmother    Hearing loss Paternal Grandmother    Social History   Tobacco Use   Smoking status: Never   Smokeless tobacco: Never  Substance Use Topics   Alcohol use: Not Currently    Alcohol/week: 1.0 standard drink of alcohol    Types: 1 Glasses of wine per week   Drug use: Never   Current Outpatient Medications  Medication Sig Dispense Refill   aspirin 81 MG EC tablet Take by mouth.     cephALEXin (KEFLEX) 500 MG  capsule Take 500 mg by mouth 4 (four) times daily.     Cholecalciferol (VITAMIN D3) 50 MCG (2000 UT) TABS      ciprofloxacin (CIPRO) 500 MG tablet Take 500 mg by mouth 2 (two) times daily.     Multiple Vitamin (MULTIVITAMIN) capsule Take 1 capsule by mouth daily.     simvastatin (ZOCOR) 20 MG tablet Take 0.5 tablets (10 mg total) by mouth at bedtime. 45 tablet 3   No current facility-administered medications for this visit.   Allergies  Allergen Reactions   Fosamax [Alendronate]      Review of Systems: All systems reviewed and negative except where noted in HPI.    No results found.  Physical Exam: BP 136/82   Pulse 91   Ht 5' 7.5" (1.715 m)   Wt 189 lb (85.7 kg)   SpO2 98%   BMI 29.16 kg/m  Constitutional: Pleasant,well-developed, Caucasian female in no acute distress. HEENT: Normocephalic and atraumatic. Conjunctivae are normal. No scleral icterus. Neck supple.  Cardiovascular: Normal rate, regular rhythm.  Pulmonary/chest: Effort normal and breath sounds normal. No wheezing, rales or rhonchi. Abdominal: Soft, nondistended, nontender. Bowel sounds active throughout. There are no masses palpable. No hepatomegaly. Extremities: no edema Lymphadenopathy: No cervical adenopathy noted. Neurological:  Alert and oriented to person place and time. Skin: Skin is warm and dry. No rashes noted. Psychiatric: Normal mood and affect. Behavior is normal.  CBC    Component Value Date/Time   WBC 6.5 09/01/2021 0848   RBC 4.62 09/01/2021 0848   HGB 14.4 09/01/2021 0848   HCT 43.9 09/01/2021 0848   PLT 266.0 09/01/2021 0848   MCV 95.1 09/01/2021 0848   MCHC 32.7 09/01/2021 0848   RDW 13.4 09/01/2021 0848    CMP     Component Value Date/Time   NA 141 09/01/2021 0848   K 4.2 09/01/2021 0848   CL 105 09/01/2021 0848   CO2 30 09/01/2021 0848   GLUCOSE 87 09/01/2021 0848   BUN 17 09/01/2021 0848   CREATININE 0.70 09/01/2021 0848   CALCIUM 8.9 09/01/2021 0848   PROT 6.8  09/01/2021 0848   ALBUMIN 4.2 09/01/2021 0848   AST 18 09/01/2021 0848   ALT 15 09/01/2021 0848   ALKPHOS 85 09/01/2021 0848   BILITOT 0.7 09/01/2021 0848     ASSESSMENT AND PLAN: 71 year old female with 1 one prior colonoscopy (details unknown), due for colon cancer screening.  Unable to obtain previous colonoscopy report, but patient believes she had no polyps bu her bowel prep was suboptimal.  She has no chronic GI symptoms and no family history of colon cancer.  Will schedule patient for routine screening colonoscopy.  Given her reported suboptimal bowel prep, will plan for a 2-day prep.  Colon cancer screening - Colonoscopy, 2 day prep  The details, risks (including bleeding, perforation, infection, missed lesions, medication reactions and possible hospitalization or surgery if complications occur), benefits, and alternatives to colonoscopy with possible biopsy and possible polypectomy were discussed with the patient and she consents to proceed.   [name] E. Candis Schatz, MD [facility]  CC: Nche, Lindsey Brooke, NP

## 2022-06-07 NOTE — Patient Instructions (Signed)
If you are age 70 or older, your body mass index should be between 23-30. Your Body mass index is 29.16 kg/m. If this is out of the aforementioned range listed, please consider follow up with your Primary Care Provider.  If you are age 66 or younger, your body mass index should be between 19-25. Your Body mass index is 29.16 kg/m. If this is out of the aformentioned range listed, please consider follow up with your Primary Care Provider.   You have been scheduled for a colonoscopy. Please follow written instructions given to you at your visit today.  Please pick up your prep supplies at the pharmacy within the next 1-3 days. If you use inhalers (even only as needed), please bring them with you on the day of your procedure.   The [facility] providers would like to encourage you to use [facility] to communicate with providers for non-urgent requests or questions.  Due to long hold times on the telephone, sending your provider a message by [facility] may be a faster and more efficient way to get a response.  Please allow 48 business hours for a response.  Please remember that this is for non-urgent requests.   It was a pleasure to see you today!  Thank you for trusting me with your gastrointestinal care!    Scott E.Candis Schatz, MD

## 2022-06-30 ENCOUNTER — Ambulatory Visit (AMBULATORY_SURGERY_CENTER): Payer: Medicare PPO | Admitting: Gastroenterology

## 2022-06-30 ENCOUNTER — Encounter: Payer: Self-pay | Admitting: Gastroenterology

## 2022-06-30 VITALS — BP 100/52 | HR 65 | Temp 97.5°F | Resp 17 | Ht 67.0 in | Wt 189.0 lb

## 2022-06-30 DIAGNOSIS — Z1212 Encounter for screening for malignant neoplasm of rectum: Secondary | ICD-10-CM

## 2022-06-30 DIAGNOSIS — D122 Benign neoplasm of ascending colon: Secondary | ICD-10-CM | POA: Diagnosis not present

## 2022-06-30 DIAGNOSIS — D120 Benign neoplasm of cecum: Secondary | ICD-10-CM | POA: Diagnosis not present

## 2022-06-30 DIAGNOSIS — Z1211 Encounter for screening for malignant neoplasm of colon: Secondary | ICD-10-CM

## 2022-06-30 MED ORDER — SODIUM CHLORIDE 0.9 % IV SOLN: 500.0000 mL | INTRAVENOUS | Status: DC

## 2022-06-30 NOTE — Progress Notes (Signed)
History and Physical Interval Note:  06/30/2022 11:45 AM  Lindsey Zuniga  has presented today for endoscopic procedure(s), with the diagnosis of  Encounter Diagnosis  Name Primary?   Screening for colorectal cancer Yes  .  The various methods of evaluation and treatment have been discussed with the patient and/or family. After consideration of risks, benefits and other options for treatment, the patient has consented to  the endoscopic procedure(s).   The patient's history has been reviewed, patient examined, no change in status, stable for endoscopic procedure(s).  I have reviewed the patient's chart and labs.  Questions were answered to the patient's satisfaction.     Hamilton Marinello E. Candis Schatz, MD [facility]

## 2022-06-30 NOTE — Progress Notes (Signed)
Called to room to assist during endoscopic procedure.  Patient ID and intended procedure confirmed with present staff. Received instructions for my participation in the procedure from the performing physician.  

## 2022-06-30 NOTE — Progress Notes (Signed)
Sedate, gd SR, tolerated procedure well, VSS, report to RN 

## 2022-06-30 NOTE — Patient Instructions (Signed)
YOU HAD AN ENDOSCOPIC PROCEDURE TODAY AT THE [facility]:   Refer to the procedure report that was given to you for any specific questions about what was found during the examination.  If the procedure report does not answer your questions, please call your gastroenterologist to clarify.  If you requested that your care partner not be given the details of your procedure findings, then the procedure report has been included in a sealed envelope for you to review at your convenience later.  YOU SHOULD EXPECT: Some feelings of bloating in the abdomen. Passage of more gas than usual.  Walking can help get rid of the air that was put into your GI tract during the procedure and reduce the bloating. If you had a lower endoscopy (such as a colonoscopy or flexible sigmoidoscopy) you may notice spotting of blood in your stool or on the toilet paper. If you underwent a bowel prep for your procedure, you may not have a normal bowel movement for a few days.  Please Note:  You might notice some irritation and congestion in your nose or some drainage.  This is from the oxygen used during your procedure.  There is no need for concern and it should clear up in a day or so.  SYMPTOMS TO REPORT IMMEDIATELY:  Following lower endoscopy (colonoscopy or flexible sigmoidoscopy):  Excessive amounts of blood in the stool  Significant tenderness or worsening of abdominal pains  Swelling of the abdomen that is new, acute  Fever of 100F or higher  Following upper endoscopy (EGD)  Vomiting of blood or coffee ground material  New chest pain or pain under the shoulder blades  Painful or persistently difficult swallowing  New shortness of breath  Fever of 100F or higher  Black, tarry-looking stools  For urgent or emergent issues, a gastroenterologist can be reached at any hour by calling [phone]. Do not use MyChart messaging for urgent concerns.    DIET:  We do recommend a small meal at first, but  then you may proceed to your regular diet.  Drink plenty of fluids but you should avoid alcoholic beverages for 24 hours.  ACTIVITY:  You should plan to take it easy for the rest of today and you should NOT DRIVE or use heavy machinery until tomorrow (because of the sedation medicines used during the test).    FOLLOW UP: Our staff will call the number listed on your records the next business day following your procedure.  We will call around 7:15- 8:00 am to check on you and address any questions or concerns that you may have regarding the information given to you following your procedure. If we do not reach you, we will leave a message.     If any biopsies were taken you will be contacted by phone or by letter within the next 1-3 weeks.  Please call us at [phone] if you have not heard about the biopsies in 3 weeks.    SIGNATURES/CONFIDENTIALITY: You and/or your care partner have signed paperwork which will be entered into your electronic medical record.  These signatures attest to the fact that that the information above on your After Visit Summary has been reviewed and is understood.  Full responsibility of the confidentiality of this discharge information lies with you and/or your care-partner.  

## 2022-06-30 NOTE — Op Note (Signed)
[place] Patient Name: Lindsey Zuniga Procedure Date: 06/30/2022 11:40 AM MRN: [id number] Endoscopist: Nicki Reaper E. Candis Schatz , MD Age: 71 Referring MD:  Date of Birth: 09-30-1951 Gender: Female Account #: [id number] Procedure:                Colonoscopy Indications:              Screening for colorectal malignant neoplasm (last                            colonoscopy was more than 10 years ago) Medicines:                Monitored Anesthesia Care Procedure:                Pre-Anesthesia Assessment:                           - Prior to the procedure, a History and Physical                            was performed, and patient medications and                            allergies were reviewed. The patient's tolerance of                            previous anesthesia was also reviewed. The risks                            and benefits of the procedure and the sedation                            options and risks were discussed with the patient.                            All questions were answered, and informed consent                            was obtained. Prior Anticoagulants: The patient has                            taken no previous anticoagulant or antiplatelet                            agents. ASA Grade Assessment: II - A patient with                            mild systemic disease. After reviewing the risks                            and benefits, the patient was deemed in                            satisfactory condition to undergo the procedure.  After obtaining informed consent, the colonoscope                            was passed under direct vision. Throughout the                            procedure, the patient's blood pressure, pulse, and                            oxygen saturations were monitored continuously. The                            Olympus PCF-H190DL (#[id number]) Colonoscope was                            introduced  through the anus and advanced to the the                            terminal ileum, with identification of the                            appendiceal orifice and IC valve. The colonoscopy                            was performed with moderate difficulty due to                            restricted mobility of the colon and a tortuous                            colon. Successful completion of the procedure was                            aided by using manual pressure and withdrawing the                            scope and replacing with the pediatric endoscope.                            The patient tolerated the procedure well. The                            quality of the bowel preparation was good. The                            terminal ileum, ileocecal valve, appendiceal                            orifice, and rectum were photographed. The bowel                            preparation used was Miralax and SUPREP via split  dose instruction. Scope In: 11:54:32 AM Scope Out: 12:21:54 PM Scope Withdrawal Time: 0 hours 13 minutes 31 seconds  Total Procedure Duration: 0 hours 27 minutes 22 seconds  Findings:                 The perianal and digital rectal examinations were                            normal. Pertinent negatives include normal                            sphincter tone and no palpable rectal lesions.                           A 5 mm polyp was found in the cecum. The polyp was                            sessile. The polyp was removed with a cold snare.                            Resection and retrieval were complete. Estimated                            blood loss was minimal.                           A 6 mm polyp was found in the ascending colon. The                            polyp was flat. The polyp was removed with a cold                            snare. Resection and retrieval were complete.                            Estimated blood loss was  minimal.                           Multiple small and large-mouthed diverticula were                            found in the sigmoid colon. There was narrowing of                            the colon in association with the diverticular                            opening.                           The exam was otherwise normal throughout the                            examined colon.  The terminal ileum appeared normal.                           The retroflexed view of the distal rectum and anal                            verge was normal and showed no anal or rectal                            abnormalities. Complications:            No immediate complications. Estimated Blood Loss:     Estimated blood loss was minimal. Impression:               - One 5 mm polyp in the cecum, removed with a cold                            snare. Resected and retrieved.                           - One 6 mm polyp in the ascending colon, removed                            with a cold snare. Resected and retrieved.                           - Moderate diverticulosis in the sigmoid colon.                            There was narrowing of the colon in association                            with the diverticular opening.                           - The examined portion of the ileum was normal.                           - The distal rectum and anal verge are normal on                            retroflexion view. Recommendation:           - Patient has a contact number available for                            emergencies. The signs and symptoms of potential                            delayed complications were discussed with the                            patient. Return to normal activities tomorrow.  Written discharge instructions were provided to the                            patient.                           - Resume previous diet.                            - Continue present medications.                           - Await pathology results.                           - Repeat colonoscopy (date not yet determined) for                            surveillance based on pathology results. Giulietta Prokop E. Candis Schatz, MD 06/30/2022 12:27:14 PM This report has been signed electronically.

## 2022-07-01 ENCOUNTER — Telehealth: Payer: Self-pay

## 2022-07-01 NOTE — Telephone Encounter (Signed)
  Follow up Call-     06/30/2022   10:49 AM  Call back number  Post procedure Call Back phone  # [phone]  Permission to leave phone message Yes     Patient questions:  Do you have a fever, pain , or abdominal swelling? No. Pain Score  0 *  Have you tolerated food without any problems? Yes.    Have you been able to return to your normal activities? Yes.    Do you have any questions about your discharge instructions: Diet   No. Medications  No. Follow up visit  No.  Do you have questions or concerns about your Care? No.  Actions: * If pain score is 4 or above: No action needed, pain <4.

## 2022-07-05 NOTE — Progress Notes (Signed)
Lindsey Zuniga,  One polyp which I removed during your recent procedure was proven to be completely benign but is considered a "pre-cancerous" polyp that MAY have grown into cancer if it had not been removed.  Studies shows that at least 20% of women over age 70 and 30% of men over age 66 have pre-cancerous polyps.  Based on current nationally recognized surveillance guidelines, I recommend that you consider having a repeat colonoscopy in 7 years.  As colon cancer screening is not universally recommended after age 64, I would recommend you have an office visit with me prior to scheduling a colonoscopy to further discuss the risks and benefits of repeat colonoscopy, based on your relative health at that time.  If you develop any new rectal bleeding, abdominal pain or significant bowel habit changes, please contact me before then.

## 2022-08-02 HISTORY — PX: GANGLION CYST EXCISION: SHX1691

## 2022-08-31 ENCOUNTER — Encounter: Payer: Self-pay | Admitting: Nurse Practitioner

## 2022-08-31 ENCOUNTER — Ambulatory Visit (INDEPENDENT_AMBULATORY_CARE_PROVIDER_SITE_OTHER): Payer: Medicare PPO | Admitting: Nurse Practitioner

## 2022-08-31 VITALS — BP 140/80 | HR 83 | Temp 97.3°F | Ht 67.0 in | Wt 189.2 lb

## 2022-08-31 DIAGNOSIS — Z1231 Encounter for screening mammogram for malignant neoplasm of breast: Secondary | ICD-10-CM

## 2022-08-31 DIAGNOSIS — R0981 Nasal congestion: Secondary | ICD-10-CM | POA: Diagnosis not present

## 2022-08-31 DIAGNOSIS — U071 COVID-19: Secondary | ICD-10-CM | POA: Diagnosis not present

## 2022-08-31 LAB — POCT INFLUENZA A/B
Influenza A, POC: NEGATIVE
Influenza B, POC: NEGATIVE

## 2022-08-31 LAB — POC COVID19 BINAXNOW: SARS Coronavirus 2 Ag: POSITIVE — AB

## 2022-08-31 MED ORDER — NIRMATRELVIR/RITONAVIR (PAXLOVID)TABLET: 3.0000 | ORAL_TABLET | Freq: Two times a day (BID) | ORAL | 0 refills | Status: AC

## 2022-08-31 MED ORDER — BENZONATATE 200 MG PO CAPS: 200.0000 mg | ORAL_CAPSULE | Freq: Three times a day (TID) | ORAL | 0 refills | Status: DC | PRN

## 2022-08-31 NOTE — Progress Notes (Signed)
Acute Office Visit  Subjective:    Patient ID: Lindsey Zuniga, female    DOB: 1951/09/21, 71 y.o.   MRN: [id number]  Chief Complaint  Patient presents with   Acute Visit    C/o sore throat, nasal congestion, denies fatigue , fever & chills     URI  This is a new problem. The current episode started in the past 7 days. The problem has been unchanged. The maximum temperature recorded prior to her arrival was 100.4 - 100.9 F. Associated symptoms include congestion, coughing, headaches, joint pain, rhinorrhea, sinus pain and a sore throat. Pertinent negatives include no abdominal pain, chest pain, diarrhea, dysuria, ear pain, joint swelling, nausea, neck pain, plugged ear sensation, rash, sneezing, swollen glands, vomiting or wheezing. She has tried nothing for the symptoms.   Outpatient Medications Prior to Visit  Medication Sig   aspirin 81 MG EC tablet Take by mouth.   Cholecalciferol (VITAMIN D3) 50 MCG (2000 UT) TABS    Multiple Vitamin (MULTIVITAMIN) capsule Take 1 capsule by mouth daily.   simvastatin (ZOCOR) 20 MG tablet Take 0.5 tablets (10 mg total) by mouth at bedtime.   No facility-administered medications prior to visit.   Reviewed past medical and social history.  Review of Systems  HENT:  Positive for congestion, rhinorrhea, sinus pain and sore throat. Negative for ear pain and sneezing.   Respiratory:  Positive for cough. Negative for wheezing.   Cardiovascular:  Negative for chest pain.  Gastrointestinal:  Negative for abdominal pain, diarrhea, nausea and vomiting.  Genitourinary:  Negative for dysuria.  Musculoskeletal:  Positive for joint pain. Negative for neck pain.  Skin:  Negative for rash.  Neurological:  Positive for headaches.   Per HPI     Objective:    Physical Exam Cardiovascular:     Rate and Rhythm: Normal rate and regular rhythm.     Pulses: Normal pulses.     Heart sounds: Normal heart sounds.  Pulmonary:     Effort: Pulmonary effort is  normal.     Breath sounds: Normal breath sounds.  Neurological:     Mental Status: She is alert and oriented to person, place, and time.  Psychiatric:        Mood and Affect: Mood normal.        Behavior: Behavior normal.        Thought Content: Thought content normal.    BP (!) 140/80 (BP Location: Right Arm, Patient Position: Sitting, Cuff Size: Normal)   Pulse 83   Temp (!) 97.3 F (36.3 C) (Temporal)   Ht '5\' 7"'$  (1.702 m)   Wt 189 lb 3.2 oz (85.8 kg)   SpO2 93%   BMI 29.63 kg/m    No results found for any visits on 08/31/22.      Assessment & Plan:   Problem List Items Addressed This Visit   None Visit Diagnoses     COVID-19    -  Primary   Relevant Medications   nirmatrelvir/ritonavir EUA (PAXLOVID) 20 x 150 MG & 10 x '100MG'$  TABS   benzonatate (TESSALON) 200 MG capsule   Breast cancer screening by mammogram       Relevant Orders   MM 3D SCREEN BREAST BILATERAL     Hold zocor x 1week, while on paxlovid therapy. Encourage adequate oral hydration. Use over-the-counter  "cold" medicines  such as "Tylenol cold" , "Advil cold",  "Mucinex" or" Mucinex D"  for cough and congestion.  Avoid decongestants if you  have high blood pressure. Use" Delsym" or" Robitussin" cough syrup varietis for cough.  You can use plain "Tylenol" or "Advi"l for fever, chills and achyness. Call office if symptoms do not improve in 1week. Go to ED if you develop chest pain, shortness of breath or palpitations or severe weakness.  Meds ordered this encounter  Medications   nirmatrelvir/ritonavir EUA (PAXLOVID) 20 x 150 MG & 10 x '100MG'$  TABS    Sig: Take 3 tablets by mouth 2 (two) times daily for 5 days. (Take nirmatrelvir 150 mg two tablets twice daily for 5 days and ritonavir 100 mg one tablet twice daily for 5 days) Patient GFR is 88    Dispense:  30 tablet    Refill:  0    Order Specific Question:   Supervising Provider    Answer:   Abelino Derrick ALFRED [[id number]]   benzonatate (TESSALON)  200 MG capsule    Sig: Take 1 capsule (200 mg total) by mouth 3 (three) times daily as needed.    Dispense:  21 capsule    Refill:  0    Order Specific Question:   Supervising Provider    Answer:   Libby Maw [[id number]]   Return in about 2 weeks (around 09/14/2022) for CPE (fasting).    Wilfred Lacy, NP

## 2022-08-31 NOTE — Patient Instructions (Addendum)
Hold zocor x 1week, while on paxlovid therapy. Encourage adequate oral hydration. Use over-the-counter  "cold" medicines  such as "Tylenol cold" , "Advil cold",  "Mucinex" or" Mucinex D"  for cough and congestion.  Avoid decongestants if you have high blood pressure. Use" Delsym" or" Robitussin" cough syrup varietis for cough.  You can use plain "Tylenol" or "Advi"l for fever, chills and achyness. Call office if symptoms do not improve in 1week. Go to ED if you develop chest pain, shortness of breath or palpitations or severe weakness.  COVID-19 COVID-19, or coronavirus disease 2019, is an infection that is caused by a new (novel) coronavirus called SARS-CoV-2. COVID-19 can cause many symptoms. In some people, the virus may not cause any symptoms. In others, it may cause mild or severe symptoms. Some people with severe infection develop severe disease. What are the causes? This illness is caused by a virus. The virus may be in the air as tiny specks of fluid (aerosols) or droplets, or it may be on surfaces. You may catch the virus by: Breathing in droplets from an infected person. Droplets can be spread by a person breathing, speaking, singing, coughing, or sneezing. Touching something, like a table or a doorknob, that has virus on it (is contaminated) and then touching your mouth, nose, or eyes. What increases the risk? Risk for infection: You are more likely to get infected with the COVID-19 virus if: You are within 6 ft (1.8 m) of a person with COVID-19 for 15 minutes or longer. You are providing care for a person who is infected with COVID-19. You are in close personal contact with other people. Close personal contact includes hugging, kissing, or sharing eating or drinking utensils. Risk for serious illness caused by COVID-19: You are more likely to get seriously ill from the COVID-19 virus if: You have cancer. You have a long-term (chronic) disease, such as: Chronic lung disease. This  includes pulmonary embolism, chronic obstructive pulmonary disease, and cystic fibrosis. Long-term disease that lowers your body's ability to fight infection (immunocompromise). Serious cardiac conditions, such as heart failure, coronary artery disease, or cardiomyopathy. Diabetes. Chronic kidney disease. Liver diseases. These include cirrhosis, nonalcoholic fatty liver disease, alcoholic liver disease, or autoimmune hepatitis. You have obesity. You are pregnant or were recently pregnant. You have sickle cell disease. What are the signs or symptoms? Symptoms of this condition can range from mild to severe. Symptoms may appear any time from 2 to 14 days after being exposed to the virus. They include: Fever or chills. Shortness of breath or trouble breathing. Feeling tired or very tired. Headaches, body aches, or muscle aches. Runny or stuffy nose, sneezing, coughing, or sore throat. New loss of taste or smell. This is rare. Some people may also have stomach problems, such as nausea, vomiting, or diarrhea. Other people may not have any symptoms of COVID-19. How is this diagnosed? This condition may be diagnosed by testing samples to check for the COVID-19 virus. The most common tests are the PCR test and the antigen test. Tests may be done in the lab or at home. They include: Using a swab to take a sample of fluid from the back of your nose and throat (nasopharyngeal fluid), from your nose, or from your throat. Testing a sample of saliva from your mouth. Testing a sample of coughed-up mucus from your lungs (sputum). How is this treated? Treatment for COVID-19 infection depends on the severity of the condition. Mild symptoms can be managed at home with rest,  fluids, and over-the-counter medicines. Serious symptoms may be treated in a hospital intensive care unit (ICU). Treatment in the ICU may include: Supplemental oxygen. Extra oxygen is given through a tube in the nose, a face mask, or a  hood. Medicines. These may include: Antivirals, such as monoclonal antibodies. These help your body fight off certain viruses that can cause disease. Anti-inflammatories, such as corticosteroids. These reduce inflammation and suppress the immune system. Antithrombotics. These prevent or treat blood clots, if they develop. Convalescent plasma. This helps boost your immune system, if you have an underlying immunosuppressive condition or are getting immunosuppressive treatments. Prone positioning. This means you will lie on your stomach. This helps oxygen to get into your lungs. Infection control measures. If you are at risk for more serious illness caused by COVID-19, your health care provider may prescribe two long-acting monoclonal antibodies, given together every 6 months. How is this prevented? To protect yourself: Use preventive medicine (pre-exposure prophylaxis). You may get pre-exposure prophylaxis if you have moderate or severe immunocompromise. Get vaccinated. Anyone 74 months old or older who meets guidelines can get a COVID-19 vaccine or vaccine series. This includes people who are pregnant or making breast milk (lactating). Get an added dose of COVID-19 vaccine after your first vaccine or vaccine series if you have moderate to severe immunocompromise. This applies if you have had a solid organ transplant or have been diagnosed with an immunocompromising condition. You should get the added dose 4 weeks after you got the first COVID-19 vaccine or vaccine series. If you get an mRNA vaccine, you will need a 3-dose primary series. If you get the J&J/Janssen vaccine, you will need a 2-dose primary series, with the second dose being an mRNA vaccine. Talk to your health care provider about getting experimental monoclonal antibodies. This treatment is approved under emergency use authorization to prevent severe illness before or after being exposed to the COVID-19 virus. You may be given monoclonal  antibodies if: You have moderate or severe immunocompromise. This includes treatments that lower your immune response. People with immunocompromise may not develop protection against COVID-19 when they are vaccinated. You cannot be vaccinated. You may not get a vaccine if you have a severe allergic reaction to the vaccine or its components. You are not fully vaccinated. You are in a facility where COVID-19 is present and: Are in close contact with a person who is infected with the COVID-19 virus. Are at high risk of being exposed to the COVID-19 virus. You are at risk of illness from new variants of the COVID-19 virus. To protect others: If you have symptoms of COVID-19, take steps to prevent the virus from spreading to others. Stay home. Leave your house only to get medical care. Do not use public transit, if possible. Do not travel while you are sick. Wash your hands often with soap and water for at least 20 seconds. If soap and water are not available, use alcohol-based hand sanitizer. Make sure that all people in your household wash their hands well and often. Cough or sneeze into a tissue or your sleeve or elbow. Do not cough or sneeze into your hand or into the air. Where to find more information Centers for Disease Control and Prevention: [employer]arm[employer] World Health Organization: https://www.castaneda.info/ Get help right away if: You have trouble breathing. You have pain or pressure in your chest. You are confused. You have bluish lips and fingernails. You have trouble waking from sleep. You have symptoms that get worse. These symptoms  may be an emergency. Get help right away. Call 911. Do not wait to see if the symptoms will go away. Do not drive yourself to the hospital. Summary COVID-19 is an infection that is caused by a new coronavirus. Sometimes, there are no symptoms. Other times, symptoms range from mild to severe. Some people with a severe COVID-19  infection develop severe disease. The virus that causes COVID-19 can spread from person to person through droplets or aerosols from breathing, speaking, singing, coughing, or sneezing. Mild symptoms of COVID-19 can be managed at home with rest, fluids, and over-the-counter medicines. This information is not intended to replace advice given to you by your health care provider. Make sure you discuss any questions you have with your health care provider. Document Revised: 09/22/2021 Document Reviewed: 09/24/2021 Elsevier Patient Education  [employer].

## 2022-08-31 NOTE — Addendum Note (Signed)
Addended by: Bynum Bellows L on: 08/31/2022 02:22 PM   Modules accepted: Orders

## 2022-09-03 ENCOUNTER — Telehealth: Payer: Self-pay | Admitting: Gastroenterology

## 2022-09-03 ENCOUNTER — Telehealth: Payer: Self-pay | Admitting: Nurse Practitioner

## 2022-09-03 MED ORDER — DICYCLOMINE HCL 20 MG PO TABS: 20.0000 mg | ORAL_TABLET | ORAL | 0 refills | Status: DC | PRN

## 2022-09-03 NOTE — Telephone Encounter (Signed)
Script sent to pharmacy as requested

## 2022-09-03 NOTE — Telephone Encounter (Signed)
Call received from Triage Nurse, who advised pt to go to the ED for anal bleeding, with and w/o stools.   Pt wants to know if she should stop taking the Paxlovid or complete the dose.  Please advise.

## 2022-09-03 NOTE — Telephone Encounter (Signed)
Pt advised via MyChart. 

## 2022-09-03 NOTE — Telephone Encounter (Signed)
FYI:Pt started taking nirmatrelvir/ritonavir EUA (PAXLOVID) 20 x 150 MG & 10 x '100MG'$  TABS [[id number]] and benzonatate (TESSALON) 200 MG capsule [[id number]]. She feels the paxlovid has caused her to have diarrhea and last night 09/02/22 blood in stool. She is needing to take her morning dose, so she called our office first. I called nurse triage, it sounds like she could be having a reaction to her medication.

## 2022-09-03 NOTE — Telephone Encounter (Signed)
Inbound call from patient stating she has covid and her pcp gave her paxlovid. She states she is experiencing diarrhea and now blood in her stool. She is requesting a call back to further advise.

## 2022-09-03 NOTE — Telephone Encounter (Signed)
Pt diagnosed with covid and started on Paxlovid Tuesday. Yesterday she started having abdominal cramping and was nauseated, she vomited x2, then she had diarrhea X3. The diarrhea had mucousy blood in it. This am she had the same diarrhea with mucous and brouwnish bloody discharge. She has called her PCP and Triage nurse instructed her to go to er for bleeding, she is reluctant to do this and she and her husband both have covid. She called PCP to try and find out if she needs to continue or stop the paxlovid. She wanted to see if Dr. Candis Schatz thinks she needs to go to the ER. Please advise.

## 2022-10-25 ENCOUNTER — Ambulatory Visit (INDEPENDENT_AMBULATORY_CARE_PROVIDER_SITE_OTHER): Payer: Medicare PPO

## 2022-10-25 VITALS — Ht 68.0 in | Wt 180.0 lb

## 2022-10-25 DIAGNOSIS — Z0000 Encounter for general adult medical examination without abnormal findings: Secondary | ICD-10-CM

## 2022-10-25 NOTE — Progress Notes (Signed)
I connected with Lindsey Zuniga today by telephone and verified that I am speaking with the correct person using two identifiers. Location patient: home Location provider: work Persons participating in the virtual visit: Margrett, [place] LPN.   I discussed the limitations, risks, security and privacy concerns of performing an evaluation and management service by telephone and the availability of in person appointments. I also discussed with the patient that there may be a patient responsible charge related to this service. The patient expressed understanding and verbally consented to this telephonic visit.    Interactive audio and video telecommunications were attempted between this provider and patient, however failed, due to patient having technical difficulties OR patient did not have access to video capability.  We continued and completed visit with audio only.     Vital signs may be patient reported or missing.  Subjective:   Lindsey Zuniga is a 72 y.o. female who presents for Medicare Annual (Subsequent) preventive examination.  Review of Systems     Cardiac Risk Factors include: advanced age (>41mn, >>24women);dyslipidemia     Objective:    Today's Vitals   10/25/22 0926  Weight: 180 lb (81.6 kg)  Height: '5\' 8"'$  (1.727 m)   Body mass index is 27.37 kg/m.     10/25/2022    9:32 AM 10/21/2021    9:51 AM 08/25/2021   12:43 PM  Advanced Directives  Does Patient Have a Medical Advance Directive? Yes Yes No;Yes  Type of AParamedicof AFriendshipLiving will HCoving[place]Living will [place]Living will  Copy of [place]in Chart? No - copy requested No - copy requested No - copy requested    Current Medications (verified) Outpatient Encounter Medications as of 10/25/2022  Medication Sig   aspirin 81 MG EC tablet Take by mouth.   Cholecalciferol (VITAMIN D3) 50 MCG (2000 UT)  TABS    Multiple Vitamin (MULTIVITAMIN) capsule Take 1 capsule by mouth daily.   simvastatin (ZOCOR) 20 MG tablet Take 0.5 tablets (10 mg total) by mouth at bedtime.   benzonatate (TESSALON) 200 MG capsule Take 1 capsule (200 mg total) by mouth 3 (three) times daily as needed. (Patient not taking: Reported on 10/25/2022)   dicyclomine (BENTYL) 20 MG tablet Take 1 tablet (20 mg total) by mouth every 4 (four) hours as needed for spasms. (Patient not taking: Reported on 10/25/2022)   No facility-administered encounter medications on file as of 10/25/2022.    Allergies (verified) Fosamax [alendronate]   History: Past Medical History:  Diagnosis Date   Allergy Unknown   Cataract 2006   Right eye cataract surgery- left eye monitored   Hyperlipidemia 02/18/2021   Osteoporosis 02/18/2021   Past Surgical History:  Procedure Laterality Date   BPaden  ENDOMETRIAL ABLATION  2004   EYE SURGERY  2006   Cataract right eye   GANGLION CYST EXCISION Left 08/02/2022   top of left foot   Family History  Problem Relation Age of Onset   Varicose Veins Mother    Cancer Father    Hearing loss Father    Varicose Veins Father    Hearing loss Maternal Grandmother    Hearing loss Maternal Grandfather    Hearing loss Paternal Grandmother    Colon cancer Neg Hx    Rectal cancer Neg Hx    Stomach cancer Neg Hx    Social  History   Socioeconomic History   Marital status: Married    Spouse name: Not on file   Number of children: 3   Years of education: Not on file   Highest education level: Not on file  Occupational History   Occupation: retried  Tobacco Use   Smoking status: Never   Smokeless tobacco: Never  Vaping Use   Vaping Use: Never used  Substance and Sexual Activity   Alcohol use: Not Currently    Alcohol/week: 1.0 standard drink of alcohol    Types: 1 Glasses of wine per week   Drug use: Never   Sexual activity: Yes    Birth  control/protection: Post-menopausal  Other Topics Concern   Not on file  Social History Narrative   Not on file   Social Determinants of Health   Financial Resource Strain: Low Risk  (10/25/2022)   Overall Financial Resource Strain (CARDIA)    Difficulty of Paying Living Expenses: Not hard at all  Food Insecurity: No Food Insecurity (10/25/2022)   Hunger Vital Sign    Worried About Running Out of Food in the Last Year: Never true    [place] in the Last Year: Never true  Transportation Needs: No Transportation Needs (10/25/2022)   PRAPARE - Hydrologist (Medical): No    Lack of Transportation (Non-Medical): No  Physical Activity: Insufficiently Active (10/25/2022)   Exercise Vital Sign    Days of Exercise per Week: 2 days    Minutes of Exercise per Session: 30 min  Stress: No Stress Concern Present (10/25/2022)   [place]    Feeling of Stress : Not at all  Social Connections: Moderately Isolated (10/21/2021)   Social Connection and Isolation Panel [NHANES]    Frequency of Communication with Friends and Family: Twice a week    Frequency of Social Gatherings with Friends and Family: Twice a week    Attends Religious Services: Never    Marine scientist or Organizations: No    Attends Music therapist: Never    Marital Status: Married    Tobacco Counseling Counseling given: Not Answered   Clinical Intake:  Pre-visit preparation completed: Yes  Pain : No/denies pain     Nutritional Status: BMI 25 -29 Overweight Nutritional Risks: None Diabetes: No  How often do you need to have someone help you when you read instructions, pamphlets, or other written materials from your doctor or pharmacy?: 1 - Never  Diabetic? no  Interpreter Needed?: No  Information entered by :: NAllen LPN   Activities of Daily Living    10/25/2022    9:33 AM 10/21/2022    9:01 AM   In your present state of health, do you have any difficulty performing the following activities:  Hearing? 0 0  Comment wears hearing aids   Vision? 0 0  Difficulty concentrating or making decisions? 0 0  Walking or climbing stairs? 0 0  Dressing or bathing? 0 0  Doing errands, shopping? 0 0  Preparing Food and eating ? N N  Using the Toilet? N N  In the past six months, have you accidently leaked urine? N N  Do you have problems with loss of bowel control? N N  Managing your Medications? N N  Managing your Finances? N N  Housekeeping or managing your Housekeeping? N N    Patient Care Team: Nche, Charlene Brooke, NP as PCP - General (  Internal Medicine)  Indicate any recent Medical Services you may have received from other than [facility] providers in the past year (date may be approximate).     Assessment:   This is a routine wellness examination for Lindsey Zuniga.  Hearing/Vision screen Vision Screening - Comments:: Regular eye exams, [facility]  Dietary issues and exercise activities discussed: Current Exercise Habits: Home exercise routine, Type of exercise: walking, Time (Minutes): 30, Frequency (Times/Week): 2, Weekly Exercise (Minutes/Week): 60   Goals Addressed             This Visit's Progress    Patient Stated       10/25/2022, wants to lose weight       Depression Screen    10/25/2022    9:32 AM 10/21/2021    9:56 AM 10/21/2021    9:49 AM 08/25/2021   11:12 AM  PHQ 2/9 Scores  PHQ - 2 Score 0 0 0 0  PHQ- 9 Score    0    Fall Risk    10/25/2022    9:32 AM 10/21/2022    9:01 AM 10/21/2021    9:52 AM 08/25/2021   12:42 PM  [place] in the past year? 0 0 0 0  Number falls in past yr: 0 0 0 0  Injury with Fall? 0 0 0 0  Risk for fall due to : No Fall Risks;Medication side effect   No Fall Risks  Follow up Falls prevention discussed;Education provided;Falls evaluation completed  Falls evaluation completed Falls evaluation completed    FALL RISK  PREVENTION PERTAINING TO THE HOME:  Any stairs in or around the home? Yes  If so, are there any without handrails? No  Home free of loose throw rugs in walkways, pet beds, electrical cords, etc? Yes  Adequate lighting in your home to reduce risk of falls? Yes   ASSISTIVE DEVICES UTILIZED TO PREVENT FALLS:  Life alert? No  Use of a cane, walker or w/c? No  Grab bars in the bathroom? Yes  Shower chair or bench in shower? Yes  Elevated toilet seat or a handicapped toilet? Yes   TIMED UP AND GO:  Was the test performed? No .     Cognitive Function:        10/25/2022    9:33 AM  6CIT Screen  What Year? 0 points  What month? 0 points  What time? 0 points  Count back from 20 0 points  Months in reverse 0 points  Repeat phrase 0 points  Total Score 0 points    Immunizations Immunization History  Administered Date(s) Administered   Influenza, High Dose Seasonal PF 07/18/2022   Influenza-Unspecified 07/17/2021   PFIZER(Purple Top)SARS-COV-2 Vaccination 11/10/2019, 12/01/2019, 10/14/2020   Pneumococcal Conjugate-13 07/14/2018   Pneumococcal Polysaccharide-23 09/27/2019   Td 08/26/2021   Zoster Recombinat (Shingrix) 09/27/2019, 12/27/2019    TDAP status: Up to date  Flu Vaccine status: Up to date  Pneumococcal vaccine status: Up to date  Covid-19 vaccine status: Completed vaccines  Qualifies for Shingles Vaccine? Yes   Zostavax completed Yes   Shingrix Completed?: Yes  Screening Tests Health Maintenance  Topic Date Due   Hepatitis C Screening  Never done   COVID-19 Vaccine (4 - 2023-24 season) 06/18/2022   MAMMOGRAM  08/14/2022   Medicare Annual Wellness (AWV)  10/21/2022   COLONOSCOPY (Pts 45-59yr Insurance coverage will need to be confirmed)  06/30/2029   DTaP/Tdap/Td (2 - Tdap) 08/27/2031   Pneumonia Vaccine 65+  Years old  Completed   INFLUENZA VACCINE  Completed   DEXA SCAN  Completed   Zoster Vaccines- Shingrix  Completed   HPV VACCINES  Aged Out     Health Maintenance  Health Maintenance Due  Topic Date Due   Hepatitis C Screening  Never done   COVID-19 Vaccine (4 - 2023-24 season) 06/18/2022   MAMMOGRAM  08/14/2022   Medicare Annual Wellness (AWV)  10/21/2022    Colorectal cancer screening: Type of screening: Colonoscopy. Completed 06/30/2022. Repeat every 7 years  Mammogram status: scheduled for 10/29/2022  Bone Density status: Completed 02/05/2022.   Lung Cancer Screening: (Low Dose CT Chest recommended if Age 75-80 years, 30 pack-year currently smoking OR have quit w/in 15years.) does not qualify.   Lung Cancer Screening Referral: no  Additional Screening:  Hepatitis C Screening: does qualify;   Vision Screening: Recommended annual ophthalmology exams for early detection of glaucoma and other disorders of the eye. Is the patient up to date with their annual eye exam?  Yes  Who is the provider or what is the name of the office in which the patient attends annual eye exams? [facility] If pt is not established with a provider, would they like to be referred to a provider to establish care? No .   Dental Screening: Recommended annual dental exams for proper oral hygiene  Community Resource Referral / Chronic Care Management: CRR required this visit?  No   CCM required this visit?  No      Plan:     I have personally reviewed and noted the following in the patient's chart:   Medical and social history Use of alcohol, tobacco or illicit drugs  Current medications and supplements including opioid prescriptions. Patient is not currently taking opioid prescriptions. Functional ability and status Nutritional status Physical activity Advanced directives List of other physicians Hospitalizations, surgeries, and ER visits in previous 12 months Vitals Screenings to include cognitive, depression, and falls Referrals and appointments  In addition, I have reviewed and discussed with patient certain  preventive protocols, quality metrics, and best practice recommendations. A written personalized care plan for preventive services as well as general preventive health recommendations were provided to patient.     Kellie Simmering, LPN   10/21/4313   Nurse Notes: none  Due to this being a virtual visit, the after visit summary with patients personalized plan was offered to patient via mail or my-chart.  Patient would like to access on my-chart

## 2022-10-25 NOTE — Patient Instructions (Signed)
Lindsey Zuniga , Thank you for taking time to come for your Medicare Wellness Visit. I appreciate your ongoing commitment to your health goals. Please review the following plan we discussed and let me know if I can assist you in the future.   These are the goals we discussed:  Goals      Patient Stated     10/25/2022, wants to lose weight        This is a list of the screening recommended for you and due dates:  Health Maintenance  Topic Date Due   Hepatitis C Screening: USPSTF Recommendation to screen - Ages 56-79 yo.  Never done   COVID-19 Vaccine (4 - 2023-24 season) 06/18/2022   Mammogram  08/14/2022   Medicare Annual Wellness Visit  10/26/2023   Colon Cancer Screening  06/30/2029   DTaP/Tdap/Td vaccine (2 - Tdap) 08/27/2031   Pneumonia Vaccine  Completed   Flu Shot  Completed   DEXA scan (bone density measurement)  Completed   Zoster (Shingles) Vaccine  Completed   HPV Vaccine  Aged Out    Advanced directives: Please bring a copy of your POA (Power of [place]) and/or Living Will to your next appointment.   Conditions/risks identified: none  Next appointment: Follow up in one year for your annual wellness visit    Preventive Care 65 Years and Older, Female Preventive care refers to lifestyle choices and visits with your health care provider that can promote health and wellness. What does preventive care include? A yearly physical exam. This is also called an annual well check. Dental exams once or twice a year. Routine eye exams. Ask your health care provider how often you should have your eyes checked. Personal lifestyle choices, including: Daily care of your teeth and gums. Regular physical activity. Eating a healthy diet. Avoiding tobacco and drug use. Limiting alcohol use. Practicing safe sex. Taking low-dose aspirin every day. Taking vitamin and mineral supplements as recommended by your health care provider. What happens during an annual well check? The  services and screenings done by your health care provider during your annual well check will depend on your age, overall health, lifestyle risk factors, and family history of disease. Counseling  Your health care provider may ask you questions about your: Alcohol use. Tobacco use. Drug use. Emotional well-being. Home and relationship well-being. Sexual activity. Eating habits. History of falls. Memory and ability to understand (cognition). Work and work Statistician. Reproductive health. Screening  You may have the following tests or measurements: Height, weight, and BMI. Blood pressure. Lipid and cholesterol levels. These may be checked every 5 years, or more frequently if you are over 62 years old. Skin check. Lung cancer screening. You may have this screening every year starting at age 65 if you have a 30-pack-year history of smoking and currently smoke or have quit within the past 15 years. Fecal occult blood test (FOBT) of the stool. You may have this test every year starting at age 35. Flexible sigmoidoscopy or colonoscopy. You may have a sigmoidoscopy every 5 years or a colonoscopy every 10 years starting at age 32. Hepatitis C blood test. Hepatitis B blood test. Sexually transmitted disease (STD) testing. Diabetes screening. This is done by checking your blood sugar (glucose) after you have not eaten for a while (fasting). You may have this done every 1-3 years. Bone density scan. This is done to screen for osteoporosis. You may have this done starting at age 53. Mammogram. This may be done every 1-2  years. Talk to your health care provider about how often you should have regular mammograms. Talk with your health care provider about your test results, treatment options, and if necessary, the need for more tests. Vaccines  Your health care provider may recommend certain vaccines, such as: Influenza vaccine. This is recommended every year. Tetanus, diphtheria, and acellular  pertussis (Tdap, Td) vaccine. You may need a Td booster every 10 years. Zoster vaccine. You may need this after age 54. Pneumococcal 13-valent conjugate (PCV13) vaccine. One dose is recommended after age 63. Pneumococcal polysaccharide (PPSV23) vaccine. One dose is recommended after age 59. Talk to your health care provider about which screenings and vaccines you need and how often you need them. This information is not intended to replace advice given to you by your health care provider. Make sure you discuss any questions you have with your health care provider. Document Released: 10/31/2015 Document Revised: 06/23/2016 Document Reviewed: 08/05/2015 Elsevier Interactive Patient Education  2017 [employer] Prevention in the Home Falls can cause injuries. They can happen to people of all ages. There are many things you can do to make your home safe and to help prevent falls. What can I do on the outside of my home? Regularly fix the edges of walkways and driveways and fix any cracks. Remove anything that might make you trip as you walk through a door, such as a raised step or threshold. Trim any bushes or trees on the path to your home. Use bright outdoor lighting. Clear any walking paths of anything that might make someone trip, such as rocks or tools. Regularly check to see if handrails are loose or broken. Make sure that both sides of any steps have handrails. Any raised decks and porches should have guardrails on the edges. Have any leaves, snow, or ice cleared regularly. Use sand or salt on walking paths during winter. Clean up any spills in your garage right away. This includes oil or grease spills. What can I do in the bathroom? Use night lights. Install grab bars by the toilet and in the tub and shower. Do not use towel bars as grab bars. Use non-skid mats or decals in the tub or shower. If you need to sit down in the shower, use a plastic, non-slip stool. Keep the floor  dry. Clean up any water that spills on the floor as soon as it happens. Remove soap buildup in the tub or shower regularly. Attach bath mats securely with double-sided non-slip rug tape. Do not have throw rugs and other things on the floor that can make you trip. What can I do in the bedroom? Use night lights. Make sure that you have a light by your bed that is easy to reach. Do not use any sheets or blankets that are too big for your bed. They should not hang down onto the floor. Have a firm chair that has side arms. You can use this for support while you get dressed. Do not have throw rugs and other things on the floor that can make you trip. What can I do in the kitchen? Clean up any spills right away. Avoid walking on wet floors. Keep items that you use a lot in easy-to-reach places. If you need to reach something above you, use a strong step stool that has a grab bar. Keep electrical cords out of the way. Do not use floor polish or wax that makes floors slippery. If you must use wax, use non-skid floor  wax. Do not have throw rugs and other things on the floor that can make you trip. What can I do with my stairs? Do not leave any items on the stairs. Make sure that there are handrails on both sides of the stairs and use them. Fix handrails that are broken or loose. Make sure that handrails are as long as the stairways. Check any carpeting to make sure that it is firmly attached to the stairs. Fix any carpet that is loose or worn. Avoid having throw rugs at the top or bottom of the stairs. If you do have throw rugs, attach them to the floor with carpet tape. Make sure that you have a light switch at the top of the stairs and the bottom of the stairs. If you do not have them, ask someone to add them for you. What else can I do to help prevent falls? Wear shoes that: Do not have high heels. Have rubber bottoms. Are comfortable and fit you well. Are closed at the toe. Do not wear  sandals. If you use a stepladder: Make sure that it is fully opened. Do not climb a closed stepladder. Make sure that both sides of the stepladder are locked into place. Ask someone to hold it for you, if possible. Clearly mark and make sure that you can see: Any grab bars or handrails. First and last steps. Where the edge of each step is. Use tools that help you move around (mobility aids) if they are needed. These include: Canes. Walkers. Scooters. Crutches. Turn on the lights when you go into a dark area. Replace any light bulbs as soon as they burn out. Set up your furniture so you have a clear path. Avoid moving your furniture around. If any of your floors are uneven, fix them. If there are any pets around you, be aware of where they are. Review your medicines with your doctor. Some medicines can make you feel dizzy. This can increase your chance of falling. Ask your doctor what other things that you can do to help prevent falls. This information is not intended to replace advice given to you by your health care provider. Make sure you discuss any questions you have with your health care provider. Document Released: 07/31/2009 Document Revised: 03/11/2016 Document Reviewed: 11/08/2014 Elsevier Interactive Patient Education  2017 [employer].

## 2022-10-29 ENCOUNTER — Ambulatory Visit
Admission: RE | Admit: 2022-10-29 | Discharge: 2022-10-29 | Disposition: A | Discharge status: Home / Self Care | Payer: Medicare PPO | Source: Ambulatory Visit | Attending: Nurse Practitioner | Admitting: Nurse Practitioner

## 2022-10-29 DIAGNOSIS — Z1231 Encounter for screening mammogram for malignant neoplasm of breast: Secondary | ICD-10-CM | POA: Diagnosis not present

## 2022-11-15 DIAGNOSIS — H903 Sensorineural hearing loss, bilateral: Secondary | ICD-10-CM | POA: Diagnosis not present

## 2022-11-26 ENCOUNTER — Other Ambulatory Visit: Payer: Self-pay | Admitting: Nurse Practitioner

## 2022-11-26 DIAGNOSIS — E785 Hyperlipidemia, unspecified: Secondary | ICD-10-CM

## 2022-11-29 DIAGNOSIS — M19071 Primary osteoarthritis, right ankle and foot: Secondary | ICD-10-CM | POA: Diagnosis not present

## 2022-11-29 DIAGNOSIS — M19072 Primary osteoarthritis, left ankle and foot: Secondary | ICD-10-CM | POA: Diagnosis not present

## 2022-11-29 DIAGNOSIS — M67472 Ganglion, left ankle and foot: Secondary | ICD-10-CM | POA: Diagnosis not present

## 2022-12-15 ENCOUNTER — Encounter: Payer: Medicare PPO | Admitting: Nurse Practitioner

## 2022-12-17 ENCOUNTER — Ambulatory Visit (INDEPENDENT_AMBULATORY_CARE_PROVIDER_SITE_OTHER): Payer: Medicare PPO | Admitting: Nurse Practitioner

## 2022-12-17 ENCOUNTER — Encounter: Payer: Self-pay | Admitting: Nurse Practitioner

## 2022-12-17 ENCOUNTER — Telehealth: Payer: Self-pay

## 2022-12-17 VITALS — BP 96/80 | HR 82 | Temp 98.5°F | Resp 16 | Ht 67.5 in | Wt 191.6 lb

## 2022-12-17 DIAGNOSIS — Z0000 Encounter for general adult medical examination without abnormal findings: Secondary | ICD-10-CM

## 2022-12-17 DIAGNOSIS — I872 Venous insufficiency (chronic) (peripheral): Secondary | ICD-10-CM | POA: Insufficient documentation

## 2022-12-17 DIAGNOSIS — E782 Mixed hyperlipidemia: Secondary | ICD-10-CM | POA: Diagnosis not present

## 2022-12-17 NOTE — Assessment & Plan Note (Signed)
LE skin hyperpigmentation, no LE edema, no ulcers, normal pedal pulses

## 2022-12-17 NOTE — Patient Instructions (Addendum)
Try amlactin cream. Ok to get COVID booster dose and RSV vaccine at the retail pharmacy. Schedule fasting lab appt. Need to be fasting 8hrs prior to blood draw. Ok to drink water and take BP meds. Continue Heart healthy diet and daily exercise. Maintain current medication  Preventive Care 65 Years and Older, Female Preventive care refers to lifestyle choices and visits with your health care provider that can promote health and wellness. Preventive care visits are also called wellness exams. What can I expect for my preventive care visit? Counseling Your health care provider may ask you questions about your: Medical history, including: Past medical problems. Family medical history. Pregnancy and menstrual history. History of falls. Current health, including: Memory and ability to understand (cognition). Emotional well-being. Home life and relationship well-being. Sexual activity and sexual health. Lifestyle, including: Alcohol, nicotine or tobacco, and drug use. Access to firearms. Diet, exercise, and sleep habits. Work and work Statistician. Sunscreen use. Safety issues such as seatbelt and bike helmet use. Physical exam Your health care provider will check your: Height and weight. These may be used to calculate your BMI (body mass index). BMI is a measurement that tells if you are at a healthy weight. Waist circumference. This measures the distance around your waistline. This measurement also tells if you are at a healthy weight and may help predict your risk of certain diseases, such as type 2 diabetes and high blood pressure. Heart rate and blood pressure. Body temperature. Skin for abnormal spots. What immunizations do I need?  Vaccines are usually given at various ages, according to a schedule. Your health care provider will recommend vaccines for you based on your age, medical history, and lifestyle or other factors, such as travel or where you work. What tests do I  need? Screening Your health care provider may recommend screening tests for certain conditions. This may include: Lipid and cholesterol levels. Hepatitis C test. Hepatitis B test. HIV (human immunodeficiency virus) test. STI (sexually transmitted infection) testing, if you are at risk. Lung cancer screening. Colorectal cancer screening. Diabetes screening. This is done by checking your blood sugar (glucose) after you have not eaten for a while (fasting). Mammogram. Talk with your health care provider about how often you should have regular mammograms. BRCA-related cancer screening. This may be done if you have a family history of breast, ovarian, tubal, or peritoneal cancers. Bone density scan. This is done to screen for osteoporosis. Talk with your health care provider about your test results, treatment options, and if necessary, the need for more tests. Follow these instructions at home: Eating and drinking  Eat a diet that includes fresh fruits and vegetables, whole grains, lean protein, and low-fat dairy products. Limit your intake of foods with high amounts of sugar, saturated fats, and salt. Take vitamin and mineral supplements as recommended by your health care provider. Do not drink alcohol if your health care provider tells you not to drink. If you drink alcohol: Limit how much you have to 0-1 drink a day. Know how much alcohol is in your drink. In the [place] one drink equals one 12 oz bottle of beer (355 mL), one 5 oz glass of wine (148 mL), or one 1 oz glass of hard liquor (44 mL). Lifestyle Brush your teeth every morning and night with fluoride toothpaste. Floss one time each day. Exercise for at least 30 minutes 5 or more days each week. Do not use any products that contain nicotine or tobacco. These products include cigarettes, chewing  tobacco, and vaping devices, such as e-cigarettes. If you need help quitting, ask your health care provider. Do not use drugs. If you are  sexually active, practice safe sex. Use a condom or other form of protection in order to prevent STIs. Take aspirin only as told by your health care provider. Make sure that you understand how much to take and what form to take. Work with your health care provider to find out whether it is safe and beneficial for you to take aspirin daily. Ask your health care provider if you need to take a cholesterol-lowering medicine (statin). Find healthy ways to manage stress, such as: Meditation, yoga, or listening to music. Journaling. Talking to a trusted person. Spending time with friends and family. Minimize exposure to UV radiation to reduce your risk of skin cancer. Safety Always wear your seat belt while driving or riding in a vehicle. Do not drive: If you have been drinking alcohol. Do not ride with someone who has been drinking. When you are tired or distracted. While texting. If you have been using any mind-altering substances or drugs. Wear a helmet and other protective equipment during sports activities. If you have firearms in your house, make sure you follow all gun safety procedures. What's next? Visit your health care provider once a year for an annual wellness visit. Ask your health care provider how often you should have your eyes and teeth checked. Stay up to date on all vaccines. This information is not intended to replace advice given to you by your health care provider. Make sure you discuss any questions you have with your health care provider. Document Revised: 04/01/2021 Document Reviewed: 04/01/2021 Elsevier Patient Education  [place].

## 2022-12-17 NOTE — Patient Outreach (Signed)
  Care Coordination   In Person Provider Office Visit Note   12/17/2022 Name: Lindsey Zuniga MRN: [id number] DOB: 04-27-1951  Lindsey Zuniga is a 72 y.o. year old female who sees Nche, Charlene Brooke, NP for primary care. I engaged with Lindsey Zuniga in the providers office today.  What matters to the patients health and wellness today?  none    Goals Addressed             This Visit's Progress    COMPLETED: Care Coordination Activities-No follow up required       Care Coordination Interventions: Advised patient to Annual Wellness exam. Discussed [facility] services and support. Assessed SDOH. Advised to discuss with primary care physician if services needed in the future. Interventions Today    Flowsheet Row Most Recent Value  General Interventions   General Interventions Discussed/Reviewed General Interventions Discussed, Doctor Visits  Doctor Visits Discussed/Reviewed Doctor Visits Discussed, Annual Wellness Visits             SDOH assessments and interventions completed:  Yes  SDOH Interventions Today    Flowsheet Row Most Recent Value  SDOH Interventions   Housing Interventions Intervention Not Indicated        Care Coordination Interventions:  Yes, provided   Follow up plan: No further intervention required.   Encounter Outcome:  Pt. Visit Completed   Jone Baseman, RN, MSN [place] [facility] Care Management Coordinator Direct Line [phone]

## 2022-12-17 NOTE — Progress Notes (Signed)
Complete physical exam  Patient: Lindsey Zuniga   DOB: April 08, 1951   72 y.o. Female  MRN: [id number] Visit Date: 12/17/2022  Subjective:    Chief Complaint  Patient presents with   Annual Exam    Fasting- No    Lindsey Zuniga is a 72 y.o. female who presents today for a complete physical exam. She reports consuming a general diet.  Walking daily  She generally feels well. She reports sleeping well. She does not have additional problems to discuss today.  Vision:Yes Dental:Yes STD Screen:No  BP Readings from Last 3 Encounters:  12/17/22 96/80  08/31/22 (!) 140/80  06/30/22 (!) 100/52   Wt Readings from Last 3 Encounters:  12/17/22 191 lb 9.6 oz (86.9 kg)  10/25/22 180 lb (81.6 kg)  08/31/22 189 lb 3.2 oz (85.8 kg)   Most recent fall risk assessment:    12/17/2022    9:16 AM  [place] in the past year? 0  Number falls in past yr: 0  Injury with Fall? 0  Risk for fall due to : No Fall Risks  Follow up Falls evaluation completed   Depression screen:Yes - No Depression  Most recent depression screenings:    10/25/2022    9:32 AM 10/21/2021    9:56 AM  PHQ 2/9 Scores  PHQ - 2 Score 0 0    HPI  Venous insufficiency of both lower extremities LE skin hyperpigmentation, no LE edema, no ulcers, normal pedal pulses   Past Medical History:  Diagnosis Date   Allergy Unknown   Cataract 2006   Right eye cataract surgery- left eye monitored   Hyperlipidemia 02/18/2021   Osteoporosis 02/18/2021   Past Surgical History:  Procedure Laterality Date   [place]   ENDOMETRIAL ABLATION  2004   EYE SURGERY  2006   Cataract right eye   GANGLION CYST EXCISION Left 08/02/2022   top of left foot   Social History   Socioeconomic History   Marital status: Married    Spouse name: Not on file   Number of children: 3   Years of education: Not on file   Highest education level: Not on file  Occupational History    Occupation: retried  Tobacco Use   Smoking status: Never   Smokeless tobacco: Never  Vaping Use   Vaping Use: Never used  Substance and Sexual Activity   Alcohol use: Not Currently    Alcohol/week: 1.0 standard drink of alcohol    Types: 1 Glasses of wine per week   Drug use: Never   Sexual activity: Yes    Birth control/protection: Post-menopausal  Other Topics Concern   Not on file  Social History Narrative   Not on file   Social Determinants of Health   Financial Resource Strain: Low Risk  (10/25/2022)   Overall Financial Resource Strain (CARDIA)    Difficulty of Paying Living Expenses: Not hard at all  Food Insecurity: No Food Insecurity (10/25/2022)   Hunger Vital Sign    Worried About Running Out of Food in the Last Year: Never true    Ran Out of Food in the Last Year: Never true  Transportation Needs: No Transportation Needs (10/25/2022)   PRAPARE - Hydrologist (Medical): No    Lack of Transportation (Non-Medical): No  Physical Activity: Insufficiently Active (10/25/2022)   Exercise Vital Sign    Days of  Exercise per Week: 2 days    Minutes of Exercise per Session: 30 min  Stress: No Stress Concern Present (10/25/2022)   [place]    Feeling of Stress : Not at all  Social Connections: Moderately Isolated (10/21/2021)   Social Connection and Isolation Panel [NHANES]    Frequency of Communication with Friends and Family: Twice a week    Frequency of Social Gatherings with Friends and Family: Twice a week    Attends Religious Services: Never    Marine scientist or Organizations: No    Attends Archivist Meetings: Never    Marital Status: Married  Human resources officer Violence: Not At Risk (10/21/2021)   Humiliation, Afraid, Rape, and Kick questionnaire    Fear of Current or Ex-Partner: No    Emotionally Abused: No    Physically Abused: No    Sexually Abused: No    Family Status  Relation Name Status   Mother Otilio Saber (Not Specified)   Father Darlin Coco Hentschel (Not Specified)   MGM Blima Rich (Not Specified)   MGF Abelardo Diesel (Not Specified)   PGM Wyline Beady (Not Specified)   Neg Hx  (Not Specified)   Family History  Problem Relation Age of Onset   Varicose Veins Mother    Cancer Father    Hearing loss Father    Varicose Veins Father    Hearing loss Maternal Grandmother    Hearing loss Maternal Grandfather    Hearing loss Paternal Grandmother    Colon cancer Neg Hx    Rectal cancer Neg Hx    Stomach cancer Neg Hx    Allergies  Allergen Reactions   Fosamax [Alendronate]    Paxlovid [Nirmatrelvir-Ritonavir] Other (See Comments)    Bloody diarrhea    Patient Care Team: Everlean Bucher, Charlene Brooke, NP as PCP - General (Internal Medicine)   Medications: Outpatient Medications Prior to Visit  Medication Sig   aspirin 81 MG EC tablet Take by mouth.   Cholecalciferol (VITAMIN D3) 50 MCG (2000 UT) TABS    Multiple Vitamin (MULTIVITAMIN) capsule Take 1 capsule by mouth daily.   simvastatin (ZOCOR) 20 MG tablet Take 0.5 tablets (10 mg total) by mouth at bedtime. Need office visit for additional refills   [DISCONTINUED] benzonatate (TESSALON) 200 MG capsule Take 1 capsule (200 mg total) by mouth 3 (three) times daily as needed.   [DISCONTINUED] dicyclomine (BENTYL) 20 MG tablet Take 1 tablet (20 mg total) by mouth every 4 (four) hours as needed for spasms. (Patient not taking: Reported on 10/25/2022)   No facility-administered medications prior to visit.    Review of Systems  Constitutional:  Negative for activity change, appetite change and unexpected weight change.  Respiratory: Negative.    Cardiovascular: Negative.   Gastrointestinal: Negative.   Endocrine: Negative for cold intolerance and heat intolerance.  Genitourinary: Negative.   Musculoskeletal: Negative.   Skin: Negative.   Neurological: Negative.    Hematological: Negative.   Psychiatric/Behavioral:  Negative for behavioral problems, decreased concentration, dysphoric mood, hallucinations, self-injury, sleep disturbance and suicidal ideas. The patient is not nervous/anxious.        Objective:  BP 96/80 (BP Location: Left Arm, Patient Position: Sitting, Cuff Size: Normal)   Pulse 82   Temp 98.5 F (36.9 C) (Temporal)   Resp 16   Ht 5' 7.5" (1.715 m)   Wt 191 lb 9.6 oz (86.9 kg)   SpO2 99%   BMI 29.57 kg/m  Physical Exam Vitals and nursing note reviewed.  Constitutional:      General: She is not in acute distress. HENT:     Right Ear: Tympanic membrane, ear canal and external ear normal.     Left Ear: Tympanic membrane, ear canal and external ear normal.     Nose: Nose normal.  Eyes:     Extraocular Movements: Extraocular movements intact.     Conjunctiva/sclera: Conjunctivae normal.     Pupils: Pupils are equal, round, and reactive to light.  Neck:     Thyroid: No thyroid mass, thyromegaly or thyroid tenderness.  Cardiovascular:     Rate and Rhythm: Normal rate and regular rhythm.     Pulses: Normal pulses.     Heart sounds: Normal heart sounds.  Pulmonary:     Effort: Pulmonary effort is normal.     Breath sounds: Normal breath sounds.  Abdominal:     General: Bowel sounds are normal.     Palpations: Abdomen is soft.  Musculoskeletal:        General: Normal range of motion.     Cervical back: Normal range of motion and neck supple.     Right lower leg: No edema.     Left lower leg: No edema.  Lymphadenopathy:     Cervical: No cervical adenopathy.  Skin:    General: Skin is warm and dry.  Neurological:     Mental Status: She is alert and oriented to person, place, and time.     Cranial Nerves: No cranial nerve deficit.  Psychiatric:        Mood and Affect: Mood normal.        Behavior: Behavior normal.        Thought Content: Thought content normal.     No results found for any visits on  12/17/22.    Assessment & Plan:    Routine Health Maintenance and Physical Exam  Immunization History  Administered Date(s) Administered   Influenza, High Dose Seasonal PF 07/18/2022   Influenza-Unspecified 07/17/2021   PFIZER(Purple Top)SARS-COV-2 Vaccination 11/10/2019, 12/01/2019, 10/14/2020   Pneumococcal Conjugate-13 07/14/2018   Pneumococcal Polysaccharide-23 09/27/2019   Td 08/26/2021   Zoster Recombinat (Shingrix) 09/27/2019, 12/27/2019   Health Maintenance  Topic Date Due   COVID-19 Vaccine (4 - 2023-24 season) 01/02/2023 (Originally 06/18/2022)   Hepatitis C Screening  12/17/2023 (Originally 05/20/1969)   Medicare Annual Wellness (AWV)  10/26/2023   MAMMOGRAM  10/30/2023   COLONOSCOPY (Pts 45-51yr Insurance coverage will need to be confirmed)  06/30/2029   DTaP/Tdap/Td (2 - Tdap) 08/27/2031   Pneumonia Vaccine 72 Years old  Completed   INFLUENZA VACCINE  Completed   DEXA SCAN  Completed   Zoster Vaccines- Shingrix  Completed   HPV VACCINES  Aged Out   Discussed health benefits of physical activity, and encouraged her to engage in regular exercise appropriate for her age and condition.  Problem List Items Addressed This Visit       Other   Hyperlipidemia   Relevant Orders   Lipid panel   Other Visit Diagnoses     Preventative health care    -  Primary   Relevant Orders   Comprehensive metabolic panel      Return in about 1 year (around 12/17/2023) for CPE (fasting).     CWilfred Lacy NP

## 2022-12-17 NOTE — Patient Instructions (Signed)
Visit Information  Thank you for taking time to visit with me today. Please don't hesitate to contact me if I can be of assistance to you.   Following are the goals we discussed today:   Goals Addressed             This Visit's Progress    COMPLETED: Care Coordination Activities-No follow up required       Care Coordination Interventions: Advised patient to Annual Wellness exam. Discussed [facility] services and support. Assessed SDOH. Advised to discuss with primary care physician if services needed in the future. Interventions Today    Flowsheet Row Most Recent Value  General Interventions   General Interventions Discussed/Reviewed General Interventions Discussed, Doctor Visits  Doctor Visits Discussed/Reviewed Doctor Visits Discussed, Annual Wellness Visits              If you are experiencing a Mental Health or [place] or need someone to talk to, please call the Suicide and Crisis Lifeline: 988   Patient verbalizes understanding of instructions and care plan provided today and agrees to view in [place]. Active MyChart status and patient understanding of how to access instructions and care plan via MyChart confirmed with patient.     The patient has been provided with contact information for the care management team and has been advised to call with any health related questions or concerns.   Jone Baseman, RN, MSN [place] [facility] Care Management Coordinator Direct Line [phone]

## 2022-12-20 ENCOUNTER — Other Ambulatory Visit: Payer: Medicare PPO

## 2022-12-23 ENCOUNTER — Other Ambulatory Visit: Payer: Medicare PPO

## 2022-12-23 LAB — COMPREHENSIVE METABOLIC PANEL
ALT: 19 U/L (ref 0–35)
AST: 20 U/L (ref 0–37)
Albumin: 3.9 g/dL (ref 3.5–5.2)
Alkaline Phosphatase: 75 U/L (ref 39–117)
BUN: 19 mg/dL (ref 6–23)
CO2: 28 mEq/L (ref 19–32)
Calcium: 9.3 mg/dL (ref 8.4–10.5)
Chloride: 106 mEq/L (ref 96–112)
Creatinine, Ser: 0.87 mg/dL (ref 0.40–1.20)
GFR: 67.42 mL/min (ref >60.00)
Glucose, Bld: 94 mg/dL (ref 70–99)
Potassium: 4.5 mEq/L (ref 3.5–5.1)
Sodium: 142 mEq/L (ref 135–145)
Total Bilirubin: 0.6 mg/dL (ref 0.2–1.2)
Total Protein: 6.6 g/dL (ref 6.0–8.3)

## 2022-12-23 LAB — LIPID PANEL
Cholesterol: 148 mg/dL (ref 0–200)
HDL: 55.6 mg/dL (ref >39.00)
LDL Cholesterol: 75 mg/dL (ref 0–99)
NonHDL: 92.51
Total CHOL/HDL Ratio: 3
Triglycerides: 90 mg/dL (ref 0.0–149.0)
VLDL: 18 mg/dL (ref 0.0–40.0)

## 2022-12-23 NOTE — Progress Notes (Signed)
Pt is here for labs

## 2022-12-23 NOTE — Progress Notes (Signed)
Stable Follow instructions as discussed during office visit.

## 2023-02-24 ENCOUNTER — Other Ambulatory Visit: Payer: Self-pay | Admitting: Nurse Practitioner

## 2023-02-24 DIAGNOSIS — E785 Hyperlipidemia, unspecified: Secondary | ICD-10-CM

## 2023-04-27 DIAGNOSIS — H903 Sensorineural hearing loss, bilateral: Secondary | ICD-10-CM | POA: Diagnosis not present

## 2023-05-16 ENCOUNTER — Telehealth: Payer: Self-pay | Admitting: Nurse Practitioner

## 2023-05-16 ENCOUNTER — Other Ambulatory Visit: Payer: Self-pay

## 2023-05-16 DIAGNOSIS — E785 Hyperlipidemia, unspecified: Secondary | ICD-10-CM

## 2023-05-16 MED ORDER — SIMVASTATIN 20 MG PO TABS
10.0000 mg | ORAL_TABLET | Freq: Every day | ORAL | 0 refills | Status: DC
Start: 2023-05-16 — End: 2023-08-18

## 2023-05-16 NOTE — Telephone Encounter (Signed)
That was an old comment and it was removed from her medication.

## 2023-05-16 NOTE — Telephone Encounter (Signed)
Called patient and made her aware that the medication was sent to the pharmacy

## 2023-05-16 NOTE — Telephone Encounter (Signed)
Pt has 7 left of simvastatin (ZOCOR) 20 MG tablet [[id number]] she saw it said she needed an appt for further refills. She is wondering why she stated she never has before. Please call.

## 2023-06-06 DIAGNOSIS — H903 Sensorineural hearing loss, bilateral: Secondary | ICD-10-CM | POA: Diagnosis not present

## 2023-08-18 ENCOUNTER — Other Ambulatory Visit: Payer: Self-pay | Admitting: Nurse Practitioner

## 2023-08-18 DIAGNOSIS — H25812 Combined forms of age-related cataract, left eye: Secondary | ICD-10-CM | POA: Diagnosis not present

## 2023-08-18 DIAGNOSIS — E785 Hyperlipidemia, unspecified: Secondary | ICD-10-CM

## 2023-08-18 DIAGNOSIS — H527 Unspecified disorder of refraction: Secondary | ICD-10-CM | POA: Diagnosis not present

## 2023-09-19 ENCOUNTER — Other Ambulatory Visit: Payer: Self-pay | Admitting: Nurse Practitioner

## 2023-09-19 DIAGNOSIS — Z1231 Encounter for screening mammogram for malignant neoplasm of breast: Secondary | ICD-10-CM

## 2023-11-02 ENCOUNTER — Ambulatory Visit
Admission: RE | Admit: 2023-11-02 | Discharge: 2023-11-02 | Disposition: A | Discharge status: Home / Self Care | Payer: Medicare PPO | Source: Ambulatory Visit | Attending: Nurse Practitioner | Admitting: Nurse Practitioner

## 2023-11-02 DIAGNOSIS — Z1231 Encounter for screening mammogram for malignant neoplasm of breast: Secondary | ICD-10-CM

## 2023-11-21 ENCOUNTER — Other Ambulatory Visit: Payer: Self-pay | Admitting: Nurse Practitioner

## 2023-11-21 DIAGNOSIS — E785 Hyperlipidemia, unspecified: Secondary | ICD-10-CM

## 2023-11-22 DIAGNOSIS — H903 Sensorineural hearing loss, bilateral: Secondary | ICD-10-CM | POA: Diagnosis not present

## 2023-12-19 ENCOUNTER — Encounter: Payer: Self-pay | Admitting: Nurse Practitioner

## 2023-12-19 ENCOUNTER — Ambulatory Visit: Payer: Medicare PPO | Admitting: Nurse Practitioner

## 2023-12-19 VITALS — BP 122/76 | HR 84 | Temp 97.6°F | Ht 68.0 in | Wt 194.2 lb

## 2023-12-19 DIAGNOSIS — Z0000 Encounter for general adult medical examination without abnormal findings: Secondary | ICD-10-CM

## 2023-12-19 DIAGNOSIS — Z780 Asymptomatic menopausal state: Secondary | ICD-10-CM | POA: Diagnosis not present

## 2023-12-19 DIAGNOSIS — M8580 Other specified disorders of bone density and structure, unspecified site: Secondary | ICD-10-CM | POA: Diagnosis not present

## 2023-12-19 DIAGNOSIS — E782 Mixed hyperlipidemia: Secondary | ICD-10-CM | POA: Diagnosis not present

## 2023-12-19 DIAGNOSIS — Z0001 Encounter for general adult medical examination with abnormal findings: Secondary | ICD-10-CM

## 2023-12-19 LAB — LIPID PANEL
Cholesterol: 159 mg/dL (ref 0–200)
HDL: 54.2 mg/dL (ref >39.00)
LDL Cholesterol: 78 mg/dL (ref 0–99)
NonHDL: 105.03
Total CHOL/HDL Ratio: 3
Triglycerides: 135 mg/dL (ref 0.0–149.0)
VLDL: 27 mg/dL (ref 0.0–40.0)

## 2023-12-19 LAB — COMPREHENSIVE METABOLIC PANEL
ALT: 20 U/L (ref 0–35)
AST: 21 U/L (ref 0–37)
Albumin: 4.1 g/dL (ref 3.5–5.2)
Alkaline Phosphatase: 65 U/L (ref 39–117)
BUN: 20 mg/dL (ref 6–23)
CO2: 28 meq/L (ref 19–32)
Calcium: 9.1 mg/dL (ref 8.4–10.5)
Chloride: 107 meq/L (ref 96–112)
Creatinine, Ser: 0.71 mg/dL (ref 0.40–1.20)
GFR: 85.45 mL/min (ref >60.00)
Glucose, Bld: 87 mg/dL (ref 70–99)
Potassium: 4.2 meq/L (ref 3.5–5.1)
Sodium: 143 meq/L (ref 135–145)
Total Bilirubin: 0.6 mg/dL (ref 0.2–1.2)
Total Protein: 7 g/dL (ref 6.0–8.3)

## 2023-12-19 NOTE — Patient Instructions (Signed)
 Go to lab Maintain Heart healthy diet and daily exercise. Maintain current medications.  How to Increase Your Level of Physical Activity Getting regular physical activity is important for your overall health and well-being. Most people do not get enough exercise. There are easy ways to increase your level of physical activity, even if you have not been very active in the past or if you are just starting out. What are the benefits of physical activity? Physical activity has many short-term and long-term benefits. Being active on a regular basis can improve your physical and mental health as well as provide other benefits. Physical health benefits Helping you lose weight or maintain a healthy weight. Strengthening your muscles and bones. Reducing your risk of certain long-term (chronic) diseases, including heart disease, cancer, and diabetes. Being able to move around more easily and for longer periods of time without getting tired (increased endurance or stamina). Improving your ability to fight off illness (enhanced immunity). Being able to sleep better. Helping you stay healthy as you get older, including: Helping you stay mobile, or capable of walking and moving around. Preventing accidents, such as falls. Increasing life expectancy. Mental health benefits Boosting your mood and improving your self-esteem. Lowering your chance of having mental health problems, such as depression or anxiety. Helping you feel good about your body. Other benefits Finding new sources of fun and enjoyment. Meeting new people who share a common interest. Before you begin If you have a chronic illness or have not been active for a while, check with your health care provider about how to get started. Ask your health care provider what activities are safe for you. Start out slowly. Walking or doing some simple chair exercises is a good place to start, especially if you have not been active before or for a long  time. Set goals that you can work toward. Ask your health care provider how much exercise is best for you. In general, most adults should: Do moderate-intensity exercise for at least 150 minutes each week (30 minutes on most days of the week) or vigorous exercise for at least 75 minutes each week, or a combination of these. Moderate-intensity exercise can include walking at a quick pace, biking, yoga, water aerobics, or gardening. Vigorous exercise involves activities that take more effort, such as jogging or running, playing sports, swimming laps, or jumping rope. Do strength exercises on at least 2 days each week. This can include weight lifting, body weight exercises, and resistance-band exercises. How to be more physically active Make a plan  Try to find activities that you enjoy. You are more likely to commit to an exercise routine if it does not feel like a chore. If you have bone or joint problems, choose low-impact exercises, like walking or swimming. Use these tips for being successful with an exercise plan: Find a workout partner for accountability. Join a group or class, such as an aerobics class, cycling class, or sports team. Make family time active. Go for a walk, bike, or swim. Include a variety of exercises each week. Consider using a fitness tracker, such as a mobile phone app or a device worn like a watch, that will count the number of steps you take each day. Many people strive to reach 10,000 steps a day. Find ways to be active in your daily routines Besides your formal exercise plans, you can find ways to do physical activity during your daily routines, such as: Walking or biking to work or to the store. Taking  the stairs instead of the elevator. Parking farther away from the door at work or at the store. Planning walking meetings. Walking around while you are on the phone. Where to find more information Centers for Disease Control and Prevention:  [employer].pt President's Council on Fitness, Sports & Nutrition: www.fitness.gov ChooseMyPlate: http://www.harvey.com/ Contact a health care provider if: You have headaches, muscle aches, or joint pain that is concerning. You feel dizzy or light-headed while exercising. You faint. You feel your heart skipping, racing, or fluttering. You have chest pain while exercising. Summary Exercise benefits your mind and body at any age, even if you are just starting out. If you have a chronic illness or have not been active for a while, check with your health care provider before increasing your physical activity. Choose activities that are safe and enjoyable for you. Ask your health care provider what activities are safe for you. Start slowly. Tell your health care provider if you have problems as you start to increase your activity level. This information is not intended to replace advice given to you by your health care provider. Make sure you discuss any questions you have with your health care provider. Document Revised: 01/30/2021 Document Reviewed: 01/30/2021 Elsevier Patient Education  2024 Elsevier Inc.  Mediterranean Diet A Mediterranean diet is based on the traditions of countries on the [employer]. It focuses on eating more: Fruits and vegetables. Whole grains, beans, nuts, and seeds. Heart-healthy fats. These are fats that are good for your heart. It involves eating less: Dairy. Meat and eggs. Processed foods with added sugar, salt, and fat. This type of diet can help prevent certain conditions. It can also improve outcomes if you have a long-term (chronic) disease, such as kidney or heart disease. What are tips for following this plan? Reading food labels Check packaged foods for: The serving size. For foods such as rice and pasta, the serving size is the amount of cooked product, not dry. The total fat. Avoid foods with saturated fat or trans fat. Added sugars,  such as corn syrup. Shopping  Try to have a balanced diet. Buy a variety of foods, such as: Fresh fruits and vegetables. You may be able to get these from local farmers markets. You can also buy them frozen. Grains, beans, nuts, and seeds. Some of these can be bought in bulk. Fresh seafood. Poultry and eggs. Low-fat dairy products. Buy whole ingredients instead of foods that have already been packaged. If you can't get fresh seafood, buy precooked frozen shrimp or canned fish, such as tuna, salmon, or sardines. Stock your pantry so you always have certain foods on hand, such as olive oil, canned tuna, canned tomatoes, rice, pasta, and beans. Cooking Cook foods with extra-virgin olive oil instead of using butter or other vegetable oils. Have meat as a side dish. Have vegetables or grains as your main dish. This means having meat in small portions or adding small amounts of meat to foods like pasta or stew. Use beans or vegetables instead of meat in common dishes like chili or lasagna. Try out different cooking methods. Try roasting, broiling, steaming, and sauting vegetables. Add frozen vegetables to soups, stews, pasta, or rice. Add nuts or seeds for added healthy fats and plant protein at each meal. You can add these to yogurt, salads, or vegetable dishes. Marinate fish or vegetables using olive oil, lemon juice, garlic, and fresh herbs. Meal planning Plan to eat a vegetarian meal one day each week. Try to work up to  two vegetarian meals, if possible. Eat seafood two or more times a week. Have healthy snacks on hand. These may include: Vegetable sticks with hummus. Greek yogurt. Fruit and nut trail mix. Eat balanced meals. These should include: Fruit: 2-3 servings a day. Vegetables: 4-5 servings a day. Low-fat dairy: 2 servings a day. Fish, poultry, or lean meat: 1 serving a day. Beans and legumes: 2 or more servings a week. Nuts and seeds: 1-2 servings a day. Whole grains: 6-8  servings a day. Extra-virgin olive oil: 3-4 servings a day. Limit red meat and sweets to just a few servings a month. Lifestyle  Try to cook and eat meals with your family. Drink enough fluid to keep your pee (urine) pale yellow. Be active every day. This includes: Aerobic exercise, which is exercise that causes your heart to beat faster. Examples include running and swimming. Leisure activities like gardening, walking, or housework. Get 7-8 hours of sleep each night. Drink red wine if your provider says you can. A glass of wine is 5 oz (150 mL). You may be allowed to have: Up to 1 glass a day if you're female and not pregnant. Up to 2 glasses a day if you're female. What foods should I eat? Fruits Apples. Apricots. Avocado. Berries. Bananas. Cherries. Dates. Figs. Grapes. Lemons. Melon. Oranges. Peaches. Plums. Pomegranate. Vegetables Artichokes. Beets. Broccoli. Cabbage. Carrots. Eggplant. Green beans. Chard. Kale. Spinach. Onions. Leeks. Peas. Squash. Tomatoes. Peppers. Radishes. Grains Whole-grain pasta. Brown rice. Bulgur wheat. Polenta. Couscous. Whole-wheat bread. Orpah Cobb. Meats and other proteins Beans. Almonds. Sunflower seeds. Pine nuts. Peanuts. Cod. Salmon. Scallops. Shrimp. Tuna. Tilapia. Clams. Oysters. Eggs. Chicken or [place] without skin. Dairy Low-fat milk. Cheese. Greek yogurt. Fats and oils Extra-virgin olive oil. Avocado oil. Grapeseed oil. Beverages Water. Red wine. Herbal tea. Sweets and desserts Greek yogurt with honey. Baked apples. Poached pears. Trail mix. Seasonings and condiments Basil. Cilantro. Coriander. Cumin. Mint. Parsley. Sage. Rosemary. Tarragon. Garlic. Oregano. Thyme. Pepper. Balsamic vinegar. Tahini. Hummus. Tomato sauce. Olives. Mushrooms. The items listed above may not be all the foods and drinks you can have. Talk to a dietitian to learn more. What foods should I limit? This is a list of foods that should be eaten  rarely. Fruits Fruit canned in syrup. Vegetables Deep-fried potatoes, like [place] fries. Grains Packaged pasta or rice dishes. Cereal with added sugar. Snacks with added sugar. Meats and other proteins Beef. Pork. Lamb. Chicken or [place] with skin. Hot dogs. Tomasa Blase. Dairy Ice cream. Sour cream. Whole milk. Fats and oils Butter. Canola oil. Vegetable oil. Beef fat (tallow). Lard. Beverages Juice. Sugar-sweetened soft drinks. Beer. Liquor and spirits. Sweets and desserts Cookies. Cakes. Pies. Candy. Seasonings and condiments Mayonnaise. Pre-made sauces and marinades. The items listed above may not be all the foods and drinks you should limit. Talk to a dietitian to learn more. Where to find more information American Heart Association (AHA): heart.org This information is not intended to replace advice given to you by your health care provider. Make sure you discuss any questions you have with your health care provider. Document Revised: 01/16/2023 Document Reviewed: 01/16/2023 Elsevier Patient Education  2024 [employer].

## 2023-12-19 NOTE — Progress Notes (Unsigned)
 Complete physical exam  Patient: Lindsey Zuniga   DOB: 05-Apr-1951   73 y.o. Female  MRN: [id number] Visit Date: 12/20/2023  Subjective:    Chief Complaint  Patient presents with   Annual Exam   Lindsey Zuniga is a 73 y.o. female who presents today for a complete physical exam. She reports consuming a low fat diet.  No specific exercise regimen  She generally feels well. She reports sleeping well. She does not have additional problems to discuss today.  Vision:Yes Dental:Yes STD Screen:No  BP Readings from Last 3 Encounters:  12/19/23 122/76  12/17/22 96/80  08/31/22 (!) 140/80   Wt Readings from Last 3 Encounters:  12/19/23 194 lb 3.2 oz (88.1 kg)  12/17/22 191 lb 9.6 oz (86.9 kg)  10/25/22 180 lb (81.6 kg)   Most recent fall risk assessment:    12/19/2023    8:20 AM  Fall Risk   Falls in the past year? 0  Number falls in past yr: 0  Injury with Fall? 0  Risk for fall due to : No Fall Risks  Follow up Falls prevention discussed;Falls evaluation completed   Depression screen:Yes - No Depression  Most recent depression screenings:    12/19/2023    8:20 AM 10/25/2022    9:32 AM  PHQ 2/9 Scores  PHQ - 2 Score 0 0  PHQ- 9 Score 0     HPI  Osteopenia after menopause Repeat dexa scan Maintain calcium, vit. D3 and daily exercise  Hyperlipidemia Current use to simvastatin 10mg  daily Repeat lipid panel   Past Medical History:  Diagnosis Date   Allergy Unknown   Cataract 2006   Right eye cataract surgery- left eye monitored   Hyperlipidemia 02/18/2021   Osteoporosis 02/18/2021   Past Surgical History:  Procedure Laterality Date   BREAST SURGERY  1985   Implants   CESAREAN SECTION  1978   ENDOMETRIAL ABLATION  2004   EYE SURGERY  2006   Cataract right eye   GANGLION CYST EXCISION Left 08/02/2022   top of left foot   Social History   Socioeconomic History   Marital status: Married    Spouse name: Not on file   Number of children: 3   Years of  education: Not on file   Highest education level: Not on file  Occupational History   Occupation: retried  Tobacco Use   Smoking status: Never   Smokeless tobacco: Never  Vaping Use   Vaping status: Never Used  Substance and Sexual Activity   Alcohol use: Not Currently    Alcohol/week: 1.0 standard drink of alcohol    Types: 1 Glasses of wine per week   Drug use: Never   Sexual activity: Yes    Birth control/protection: Post-menopausal  Other Topics Concern   Not on file  Social History Narrative   Not on file   Social Drivers of Health   Financial Resource Strain: Low Risk  (10/25/2022)   Overall Financial Resource Strain (CARDIA)    Difficulty of Paying Living Expenses: Not hard at all  Food Insecurity: No Food Insecurity (10/25/2022)   Hunger Vital Sign    Worried About Running Out of Food in the Last Year: Never true    Ran Out of Food in the Last Year: Never true  Transportation Needs: No Transportation Needs (10/25/2022)   PRAPARE - Administrator, Civil Service (Medical): No    Lack of Transportation (Non-Medical): No  Physical Activity: Insufficiently Active (  10/25/2022)   Exercise Vital Sign    Days of Exercise per Week: 2 days    Minutes of Exercise per Session: 30 min  Stress: No Stress Concern Present (10/25/2022)   [employer] of Occupational Health - Occupational Stress Questionnaire    Feeling of Stress : Not at all  Social Connections: Moderately Isolated (10/21/2021)   Social Connection and Isolation Panel [NHANES]    Frequency of Communication with Friends and Family: Twice a week    Frequency of Social Gatherings with Friends and Family: Twice a week    Attends Religious Services: Never    Database administrator or Organizations: No    Attends Banker Meetings: Never    Marital Status: Married  Catering manager Violence: Not At Risk (10/21/2021)   Humiliation, Afraid, Rape, and Kick questionnaire    Fear of Current or Ex-Partner:  No    Emotionally Abused: No    Physically Abused: No    Sexually Abused: No   Family Status  Relation Name Status   Mother Franky Macho (Not Specified)   Father Danne [place] Hentschel (Not Specified)   MGM Tyrone Apple (Not Specified)   MGF Gordy Levan (Not Specified)   PGM Ross Marcus (Not Specified)   Neg Hx  (Not Specified)  No partnership data on file   Family History  Problem Relation Age of Onset   Varicose Veins Mother    Cancer Father    Hearing loss Father    Varicose Veins Father    Hearing loss Maternal Grandmother    Hearing loss Maternal Grandfather    Hearing loss Paternal Grandmother    Colon cancer Neg Hx    Rectal cancer Neg Hx    Stomach cancer Neg Hx    Allergies  Allergen Reactions   Fosamax [Alendronate]    Paxlovid [Nirmatrelvir-Ritonavir] Other (See Comments)    Bloody diarrhea    Patient Care Team: Adrieana Fennelly, Bonna Gains, NP as PCP - General (Internal Medicine)   Medications: Outpatient Medications Prior to Visit  Medication Sig   aspirin 81 MG EC tablet Take by mouth.   Multiple Vitamin (MULTIVITAMIN) capsule Take 1 capsule by mouth daily.   simvastatin (ZOCOR) 20 MG tablet TAKE 0.5 TABLETS (10 MG TOTAL) BY MOUTH AT BEDTIME.   [DISCONTINUED] Cholecalciferol (VITAMIN D3) 50 MCG (2000 UT) TABS    No facility-administered medications prior to visit.    Review of Systems  Constitutional:  Negative for activity change, appetite change and unexpected weight change.  Respiratory: Negative.    Cardiovascular: Negative.   Gastrointestinal: Negative.   Endocrine: Negative for cold intolerance and heat intolerance.  Genitourinary: Negative.   Musculoskeletal: Negative.   Skin: Negative.   Neurological: Negative.   Hematological: Negative.   Psychiatric/Behavioral:  Negative for behavioral problems, decreased concentration, dysphoric mood, hallucinations, self-injury, sleep disturbance and suicidal ideas. The patient is not  nervous/anxious.        Objective:  BP 122/76 (BP Location: Right Arm, Patient Position: Sitting, Cuff Size: Normal)   Pulse 84   Temp 97.6 F (36.4 C) (Temporal)   Ht 5\' 8"  (1.727 m)   Wt 194 lb 3.2 oz (88.1 kg)   SpO2 97%   BMI 29.53 kg/m     Physical Exam Vitals and nursing note reviewed.  Constitutional:      General: She is not in acute distress. HENT:     Right Ear: Tympanic membrane, ear canal and external ear normal.     Left  Ear: Tympanic membrane, ear canal and external ear normal.     Nose: Nose normal.  Eyes:     Extraocular Movements: Extraocular movements intact.     Conjunctiva/sclera: Conjunctivae normal.     Pupils: Pupils are equal, round, and reactive to light.  Neck:     Thyroid: No thyroid mass, thyromegaly or thyroid tenderness.  Cardiovascular:     Rate and Rhythm: Normal rate and regular rhythm.     Pulses: Normal pulses.     Heart sounds: Normal heart sounds.  Pulmonary:     Effort: Pulmonary effort is normal.     Breath sounds: Normal breath sounds.  Abdominal:     General: Bowel sounds are normal.     Palpations: Abdomen is soft.  Musculoskeletal:        General: Normal range of motion.     Cervical back: Normal range of motion and neck supple.     Right lower leg: No edema.     Left lower leg: No edema.  Lymphadenopathy:     Cervical: No cervical adenopathy.  Skin:    General: Skin is warm and dry.  Neurological:     Mental Status: She is alert and oriented to person, place, and time.     Cranial Nerves: No cranial nerve deficit.  Psychiatric:        Mood and Affect: Mood normal.        Behavior: Behavior normal.        Thought Content: Thought content normal.      Results for orders placed or performed in visit on 12/19/23  Comprehensive metabolic panel  Result Value Ref Range   Sodium 143 135 - 145 mEq/L   Potassium 4.2 3.5 - 5.1 mEq/L   Chloride 107 96 - 112 mEq/L   CO2 28 19 - 32 mEq/L   Glucose, Bld 87 70 - 99 mg/dL    BUN 20 6 - 23 mg/dL   Creatinine, Ser 1.61 0.40 - 1.20 mg/dL   Total Bilirubin 0.6 0.2 - 1.2 mg/dL   Alkaline Phosphatase 65 39 - 117 U/L   AST 21 0 - 37 U/L   ALT 20 0 - 35 U/L   Total Protein 7.0 6.0 - 8.3 g/dL   Albumin 4.1 3.5 - 5.2 g/dL   GFR 09.60 >45.40 mL/min   Calcium 9.1 8.4 - 10.5 mg/dL  Lipid panel  Result Value Ref Range   Cholesterol 159 0 - 200 mg/dL   Triglycerides 981.1 0.0 - 149.0 mg/dL   HDL 91.47 >82.95 mg/dL   VLDL 62.1 0.0 - 30.8 mg/dL   LDL Cholesterol 78 0 - 99 mg/dL   Total CHOL/HDL Ratio 3    NonHDL 105.03       Assessment & Plan:    Routine Health Maintenance and Physical Exam  Immunization History  Administered Date(s) Administered   Fluad Trivalent(High Dose 65+) 07/15/2023   Influenza, High Dose Seasonal PF 07/18/2022   Influenza,trivalent, recombinat, inj, PF 07/15/2023   Influenza-Unspecified 07/17/2021, 07/15/2023   Moderna Covid-19 Fall Seasonal Vaccine 70yrs & older 07/15/2023   Moderna Covid-19 Vaccine Bivalent Booster 35yrs & up 07/15/2023   PFIZER(Purple Top)SARS-COV-2 Vaccination 11/10/2019, 12/01/2019, 10/14/2020   Pfizer(Comirnaty)Fall Seasonal Vaccine 12 years and older 12/27/2022   Pneumococcal Conjugate-13 07/14/2018   Pneumococcal Polysaccharide-23 09/27/2019   Respiratory Syncytial Virus Vaccine,Recomb Aduvanted(Arexvy) 12/27/2022   Td 08/26/2021   Zoster Recombinant(Shingrix) 09/27/2019, 12/27/2019   Health Maintenance  Topic Date Due   COVID-19 Vaccine (6 -  2024-25 season) 09/09/2023   Medicare Annual Wellness (AWV)  10/26/2023   Hepatitis C Screening  12/19/2024 (Originally 05/20/1969)   MAMMOGRAM  11/01/2024   Colonoscopy  06/30/2029   DTaP/Tdap/Td (2 - Tdap) 08/27/2031   Pneumonia Vaccine 70+ Years old  Completed   INFLUENZA VACCINE  Completed   DEXA SCAN  Completed   Zoster Vaccines- Shingrix  Completed   HPV VACCINES  Aged Out   Discussed health benefits of physical activity, and encouraged her to engage in  regular exercise appropriate for her age and condition.  Problem List Items Addressed This Visit     Hyperlipidemia   Current use to simvastatin 10mg  daily Repeat lipid panel      Relevant Orders   Lipid panel (Completed)   Osteopenia after menopause   Repeat dexa scan Maintain calcium, vit. D3 and daily exercise      Relevant Orders   DG Bone Density   Other Visit Diagnoses       Encounter for preventative adult health care exam with abnormal findings    -  Primary   Relevant Orders   Comprehensive metabolic panel (Completed)      Return in about 1 year (around 12/18/2024) for CPE (fasting).     Alysia Penna, NP

## 2023-12-20 NOTE — Assessment & Plan Note (Signed)
Current use to simvastatin 10mg  daily Repeat lipid panel

## 2023-12-20 NOTE — Assessment & Plan Note (Signed)
 Repeat dexa scan Maintain calcium, vit. D3 and daily exercise

## 2024-02-20 ENCOUNTER — Other Ambulatory Visit: Payer: Self-pay | Admitting: Nurse Practitioner

## 2024-02-20 DIAGNOSIS — E785 Hyperlipidemia, unspecified: Secondary | ICD-10-CM

## 2024-02-22 ENCOUNTER — Other Ambulatory Visit: Payer: Self-pay | Admitting: Nurse Practitioner

## 2024-02-22 DIAGNOSIS — E785 Hyperlipidemia, unspecified: Secondary | ICD-10-CM

## 2024-02-23 MED ORDER — SIMVASTATIN 20 MG PO TABS: 10.0000 mg | ORAL_TABLET | Freq: Every day | ORAL | 2 refills | Status: DC

## 2024-04-23 ENCOUNTER — Ambulatory Visit (INDEPENDENT_AMBULATORY_CARE_PROVIDER_SITE_OTHER)

## 2024-04-23 DIAGNOSIS — Z0000 Encounter for general adult medical examination without abnormal findings: Secondary | ICD-10-CM

## 2024-04-23 NOTE — Patient Instructions (Signed)
 Lindsey Zuniga , Thank you for taking time out of your busy schedule to complete your Annual Wellness Visit with me. I enjoyed our conversation and look forward to speaking with you again next year. I, as well as your care team,  appreciate your ongoing commitment to your health goals. Please review the following plan we discussed and let me know if I can assist you in the future. Your Game plan/ To Do List    Referrals: If you haven't heard from the office you've been referred to, please reach out to them at the phone provided.  N/a Follow up Visits: Next Medicare AWV with our clinical staff: 04/29/2025 at 4:20   Have you seen your provider in the last 6 months (3 months if uncontrolled diabetes)? Yes Next Office Visit with your provider: 12/20/2024 at 8:40  Clinician Recommendations:  Aim for 30 minutes of exercise or brisk walking, 6-8 glasses of water, and 5 servings of fruits and vegetables each day.       This is a list of the screening recommended for you and due dates:  Health Maintenance  Topic Date Due   COVID-19 Vaccine (6 - Pfizer risk 2024-25 season) 01/12/2024   Hepatitis C Screening  12/19/2024*   Flu Shot  05/18/2024   Mammogram  11/01/2024   Medicare Annual Wellness Visit  04/23/2025   Colon Cancer Screening  06/30/2029   DTaP/Tdap/Td vaccine (2 - Tdap) 08/27/2031   Pneumococcal Vaccine for age over 17  Completed   DEXA scan (bone density measurement)  Completed   Zoster (Shingles) Vaccine  Completed   Hepatitis B Vaccine  Aged Out   HPV Vaccine  Aged Out   Meningitis B Vaccine  Aged Out  *Topic was postponed. The date shown is not the original due date.    Advanced directives: (Copy Requested) Please bring a copy of your health care power of attorney and living will to the office to be added to your chart at your convenience. You can mail to [facility] [address] [place], [place] [address] or email to [email or web address] Advance Care  Planning is important because it:  [x]  Makes sure you receive the medical care that is consistent with your values, goals, and preferences  [x]  It provides guidance to your family and loved ones and reduces their decisional burden about whether or not they are making the right decisions based on your wishes.  Follow the link provided in your after visit summary or read over the paperwork we have mailed to you to help you started getting your Advance Directives in place. If you need assistance in completing these, please reach out to us  so that we can help you!  See attachments for Preventive Care and Fall Prevention Tips.

## 2024-04-23 NOTE — Progress Notes (Signed)
 Subjective:   Lindsey Zuniga is a 73 y.o. who presents for a Medicare Wellness preventive visit.  As a reminder, Annual Wellness Visits don't include a physical exam, and some assessments may be limited, especially if this visit is performed virtually. We may recommend an in-person follow-up visit with your provider if needed.  Visit Complete: Virtual I connected with  Haroldine D Segreto on 04/23/24 by a video and audio enabled telemedicine application and verified that I am speaking with the correct person using two identifiers.  Patient Location: Home  Provider Location: Office/Clinic  I discussed the limitations of evaluation and management by telemedicine. The patient expressed understanding and agreed to proceed.  Vital Signs: Because this visit was a virtual/telehealth visit, some criteria may be missing or patient reported. Any vitals not documented were not able to be obtained and vitals that have been documented are patient reported.    Persons Participating in Visit: Patient.  AWV Questionnaire: Yes: Patient Medicare AWV questionnaire was completed by the patient on 04/20/2024; I have confirmed that all information answered by patient is correct and no changes since this date.  Cardiac Risk Factors include: advanced age (>69men, >35 women);dyslipidemia     Objective:    Today's Vitals   There is no height or weight on file to calculate BMI.     04/23/2024    4:14 PM 10/25/2022    9:32 AM 10/21/2021    9:51 AM 08/25/2021   12:43 PM  Advanced Directives  Does Patient Have a Medical Advance Directive? Yes Yes Yes No;Yes  Type of Estate agent of [place];Living will Healthcare Power of [place];Living will Healthcare Power of [place];Living will Healthcare Power of [place];Living will  Copy of Healthcare Power of Attorney in Chart?  No - copy requested No - copy requested No - copy requested    Current Medications (verified) Outpatient  Encounter Medications as of 04/23/2024  Medication Sig   aspirin 81 MG EC tablet Take by mouth.   Multiple Vitamin (MULTIVITAMIN) capsule Take 1 capsule by mouth daily.   simvastatin  (ZOCOR ) 20 MG tablet Take 0.5 tablets (10 mg total) by mouth at bedtime.   No facility-administered encounter medications on file as of 04/23/2024.    Allergies (verified) Fosamax [alendronate] and Paxlovid  [nirmatrelvir -ritonavir ]   History: Past Medical History:  Diagnosis Date   Allergy Unknown   Cataract 2006   Right eye cataract surgery- left eye monitored   Hyperlipidemia 02/18/2021   Osteoporosis 02/18/2021   Past Surgical History:  Procedure Laterality Date   BREAST SURGERY  1985   Implants   CESAREAN SECTION  1978   ENDOMETRIAL ABLATION  2004   EYE SURGERY  2006   Cataract right eye   GANGLION CYST EXCISION Left 08/02/2022   top of left foot   Family History  Problem Relation Age of Onset   Varicose Veins Mother    Cancer Father    Hearing loss Father    Varicose Veins Father    Hearing loss Maternal Grandmother    Hearing loss Maternal Grandfather    Hearing loss Paternal Grandmother    Colon cancer Neg Hx    Rectal cancer Neg Hx    Stomach cancer Neg Hx    Social History   Socioeconomic History   Marital status: Married    Spouse name: Not on file   Number of children: 3   Years of education: Not on file   Highest education level: 12th grade  Occupational History  Occupation: retried  Tobacco Use   Smoking status: Never   Smokeless tobacco: Never  Vaping Use   Vaping status: Never Used  Substance and Sexual Activity   Alcohol use: Not Currently    Alcohol/week: 1.0 standard drink of alcohol    Types: 1 Glasses of wine per week   Drug use: Never   Sexual activity: Yes    Birth control/protection: Post-menopausal  Other Topics Concern   Not on file  Social History Narrative   Not on file   Social Drivers of Health   Financial Resource Strain: Low Risk   (04/20/2024)   Overall Financial Resource Strain (CARDIA)    Difficulty of Paying Living Expenses: Not hard at all  Food Insecurity: No Food Insecurity (04/20/2024)   Hunger Vital Sign    Worried About Running Out of Food in the Last Year: Never true    Ran Out of Food in the Last Year: Never true  Transportation Needs: No Transportation Needs (04/20/2024)   PRAPARE - Administrator, Civil Service (Medical): No    Lack of Transportation (Non-Medical): No  Physical Activity: Insufficiently Active (04/20/2024)   Exercise Vital Sign    Days of Exercise per Week: 3 days    Minutes of Exercise per Session: 30 min  Stress: No Stress Concern Present (04/20/2024)   [employer] of Occupational Health - Occupational Stress Questionnaire    Feeling of Stress: Not at all  Social Connections: Moderately Isolated (04/20/2024)   Social Connection and Isolation Panel    Frequency of Communication with Friends and Family: More than three times a week    Frequency of Social Gatherings with Friends and Family: Once a week    Attends Religious Services: Patient declined    Database administrator or Organizations: No    Attends Engineer, structural: Not on file    Marital Status: Married    Tobacco Counseling Counseling given: Not Answered    Clinical Intake:  Pre-visit preparation completed: Yes  Pain : No/denies pain     Nutritional Risks: None Diabetes: No  No results found for: HGBA1C   How often do you need to have someone help you when you read instructions, pamphlets, or other written materials from your doctor or pharmacy?: 1 - Never  Interpreter Needed?: No  Information entered by :: NAllen LPN   Activities of Daily Living     04/23/2024    4:11 PM  In your present state of health, do you have any difficulty performing the following activities:  Hearing? 1  Comment has hearing aids  Vision? 0  Difficulty concentrating or making decisions? 0  Walking or  climbing stairs? 0  Dressing or bathing? 0  Doing errands, shopping? 0  Preparing Food and eating ? N  Using the Toilet? N  In the past six months, have you accidently leaked urine? Y  Comment with laugh or cough  Do you have problems with loss of bowel control? N  Managing your Medications? N  Managing your Finances? N  Housekeeping or managing your Housekeeping? N    Patient Care Team: Nche, Roselie Rockford, NP as PCP - General (Internal Medicine)  I have updated your Care Teams any recent Medical Services you may have received from other providers in the past year.     Assessment:   This is a routine wellness examination for [place].  Hearing/Vision screen Hearing Screening - Comments:: Has hearing aids that are maintained Vision Screening -  Comments:: Regular eye exams, [facility]   Goals Addressed             This Visit's Progress    Patient Stated       04/23/2024, wants to lose weight       Depression Screen     04/23/2024    4:16 PM 12/19/2023    8:20 AM 10/25/2022    9:32 AM 10/21/2021    9:56 AM 10/21/2021    9:49 AM 08/25/2021   11:12 AM  PHQ 2/9 Scores  PHQ - 2 Score 1 0 0 0 0 0  PHQ- 9 Score 2 0    0    Fall Risk     04/23/2024    4:15 PM 12/19/2023    8:20 AM 12/17/2022    9:16 AM 10/25/2022    9:32 AM 10/21/2022    9:01 AM  Fall Risk   Falls in the past year? 0 0 0 0 0  Number falls in past yr: 0 0 0 0 0  Injury with Fall? 0 0 0 0 0  Risk for fall due to : Medication side effect No Fall Risks No Fall Risks No Fall Risks;Medication side effect   Follow up Falls evaluation completed;Falls prevention discussed Falls prevention discussed;Falls evaluation completed Falls evaluation completed Falls prevention discussed;Education provided;Falls evaluation completed       Data saved with a previous flowsheet row definition    MEDICARE RISK AT HOME:  Medicare Risk at Home Any stairs in or around the home?: Yes If so, are there any without handrails?:  No Home free of loose throw rugs in walkways, pet beds, electrical cords, etc?: Yes Adequate lighting in your home to reduce risk of falls?: Yes Life alert?: No Use of a cane, walker or w/c?: No Grab bars in the bathroom?: Yes Shower chair or bench in shower?: Yes Elevated toilet seat or a handicapped toilet?: Yes  TIMED UP AND GO:  Was the test performed?  No  Cognitive Function: 6CIT completed        04/23/2024    4:19 PM 10/25/2022    9:33 AM  6CIT Screen  What Year? 0 points 0 points  What month? 0 points 0 points  What time? 0 points 0 points  Count back from 20 0 points 0 points  Months in reverse 0 points 0 points  Repeat phrase 0 points 0 points  Total Score 0 points 0 points    Immunizations Immunization History  Administered Date(s) Administered   Fluad Trivalent(High Dose 65+) 07/15/2023   Influenza, High Dose Seasonal PF 07/18/2022   Influenza,trivalent, recombinat, inj, PF 07/15/2023   Influenza-Unspecified 07/17/2021, 07/15/2023   Moderna Covid-19 Fall Seasonal Vaccine 70yrs & older 07/15/2023   Moderna Covid-19 Vaccine Bivalent Booster 33yrs & up 07/15/2023   PFIZER(Purple Top)SARS-COV-2 Vaccination 11/10/2019, 12/01/2019, 10/14/2020   Pfizer(Comirnaty)Fall Seasonal Vaccine 12 years and older 12/27/2022   Pneumococcal Conjugate-13 07/14/2018   Pneumococcal Polysaccharide-23 09/27/2019   Respiratory Syncytial Virus Vaccine,Recomb Aduvanted(Arexvy) 12/27/2022   Td 08/26/2021   Zoster Recombinant(Shingrix) 09/27/2019, 12/27/2019    Screening Tests Health Maintenance  Topic Date Due   COVID-19 Vaccine (6 - Pfizer risk 2024-25 season) 01/12/2024   Hepatitis C Screening  12/19/2024 (Originally 05/20/1969)   INFLUENZA VACCINE  05/18/2024   MAMMOGRAM  11/01/2024   Medicare Annual Wellness (AWV)  04/23/2025   Colonoscopy  06/30/2029   DTaP/Tdap/Td (2 - Tdap) 08/27/2031   Pneumococcal Vaccine: 50+ Years  Completed  DEXA SCAN  Completed   Zoster Vaccines-  Shingrix  Completed   Hepatitis B Vaccines  Aged Out   HPV VACCINES  Aged Out   Meningococcal B Vaccine  Aged Out    Health Maintenance  Health Maintenance Due  Topic Date Due   COVID-19 Vaccine (6 - Pfizer risk 2024-25 season) 01/12/2024   Health Maintenance Items Addressed: Up to date  Additional Screening:  Vision Screening: Recommended annual ophthalmology exams for early detection of glaucoma and other disorders of the eye. Would you like a referral to an eye doctor? No    Dental Screening: Recommended annual dental exams for proper oral hygiene  Community Resource Referral / Chronic Care Management: CRR required this visit?  No   CCM required this visit?  No   Plan:    I have personally reviewed and noted the following in the patient's chart:   Medical and social history Use of alcohol, tobacco or illicit drugs  Current medications and supplements including opioid prescriptions. Patient is not currently taking opioid prescriptions. Functional ability and status Nutritional status Physical activity Advanced directives List of other physicians Hospitalizations, surgeries, and ER visits in previous 12 months Vitals Screenings to include cognitive, depression, and falls Referrals and appointments  In addition, I have reviewed and discussed with patient certain preventive protocols, quality metrics, and best practice recommendations. A written personalized care plan for preventive services as well as general preventive health recommendations were provided to patient.   Ardella FORBES Dawn, LPN   11/19/7972   After Visit Summary: (MyChart) Due to this being a telephonic visit, the after visit summary with patients personalized plan was offered to patient via MyChart   Notes: Nothing significant to report at this time.

## 2024-05-31 DIAGNOSIS — H903 Sensorineural hearing loss, bilateral: Secondary | ICD-10-CM | POA: Diagnosis not present

## 2024-08-16 ENCOUNTER — Other Ambulatory Visit

## 2024-08-23 DIAGNOSIS — H25812 Combined forms of age-related cataract, left eye: Secondary | ICD-10-CM | POA: Diagnosis not present

## 2024-08-23 DIAGNOSIS — H527 Unspecified disorder of refraction: Secondary | ICD-10-CM | POA: Diagnosis not present

## 2024-08-28 ENCOUNTER — Ambulatory Visit (HOSPITAL_BASED_OUTPATIENT_CLINIC_OR_DEPARTMENT_OTHER)
Admission: RE | Admit: 2024-08-28 | Discharge: 2024-08-28 | Disposition: A | Discharge status: Home / Self Care | Source: Ambulatory Visit | Attending: Nurse Practitioner | Admitting: Nurse Practitioner

## 2024-08-28 DIAGNOSIS — M8589 Other specified disorders of bone density and structure, multiple sites: Secondary | ICD-10-CM | POA: Diagnosis not present

## 2024-08-28 DIAGNOSIS — Z780 Asymptomatic menopausal state: Secondary | ICD-10-CM | POA: Insufficient documentation

## 2024-08-28 DIAGNOSIS — M8580 Other specified disorders of bone density and structure, unspecified site: Secondary | ICD-10-CM | POA: Diagnosis not present

## 2024-08-29 ENCOUNTER — Ambulatory Visit: Payer: Self-pay | Admitting: Nurse Practitioner

## 2024-09-18 ENCOUNTER — Other Ambulatory Visit: Payer: Self-pay | Admitting: Nurse Practitioner

## 2024-09-18 DIAGNOSIS — Z1231 Encounter for screening mammogram for malignant neoplasm of breast: Secondary | ICD-10-CM

## 2024-11-02 ENCOUNTER — Ambulatory Visit
Admission: RE | Admit: 2024-11-02 | Discharge: 2024-11-02 | Disposition: A | Discharge status: Home / Self Care | Source: Ambulatory Visit | Attending: Nurse Practitioner | Admitting: Nurse Practitioner

## 2024-11-02 DIAGNOSIS — Z1231 Encounter for screening mammogram for malignant neoplasm of breast: Secondary | ICD-10-CM

## 2024-11-16 ENCOUNTER — Other Ambulatory Visit: Payer: Self-pay | Admitting: Nurse Practitioner

## 2024-11-16 DIAGNOSIS — E785 Hyperlipidemia, unspecified: Secondary | ICD-10-CM

## 2024-12-20 ENCOUNTER — Encounter: Admitting: Nurse Practitioner

## 2025-04-29 ENCOUNTER — Ambulatory Visit
